# Patient Record
Sex: Female | Born: 1960 | Race: Black or African American | Hispanic: No | Marital: Married | Smoking: Former smoker
Health system: Southern US, Community
[De-identification: ages and names within clinical notes are randomized; demographics above are authoritative.]

## PROBLEM LIST (undated history)

## (undated) DIAGNOSIS — F419 Anxiety disorder, unspecified: Secondary | ICD-10-CM

## (undated) DIAGNOSIS — K589 Irritable bowel syndrome without diarrhea: Secondary | ICD-10-CM

## (undated) DIAGNOSIS — K2970 Gastritis, unspecified, without bleeding: Secondary | ICD-10-CM

## (undated) DIAGNOSIS — F329 Major depressive disorder, single episode, unspecified: Secondary | ICD-10-CM

## (undated) DIAGNOSIS — D3001 Benign neoplasm of right kidney: Secondary | ICD-10-CM

## (undated) DIAGNOSIS — K838 Other specified diseases of biliary tract: Secondary | ICD-10-CM

## (undated) DIAGNOSIS — B9681 Helicobacter pylori [H. pylori] as the cause of diseases classified elsewhere: Secondary | ICD-10-CM

## (undated) DIAGNOSIS — Z87442 Personal history of urinary calculi: Secondary | ICD-10-CM

## (undated) DIAGNOSIS — K219 Gastro-esophageal reflux disease without esophagitis: Secondary | ICD-10-CM

## (undated) DIAGNOSIS — K8020 Calculus of gallbladder without cholecystitis without obstruction: Secondary | ICD-10-CM

## (undated) DIAGNOSIS — F32A Depression, unspecified: Secondary | ICD-10-CM

## (undated) HISTORY — DX: Benign neoplasm of right kidney: D30.01

## (undated) HISTORY — PX: APPENDECTOMY: SHX54

## (undated) HISTORY — DX: Gastritis, unspecified, without bleeding: K29.70

## (undated) HISTORY — DX: Helicobacter pylori (H. pylori) as the cause of diseases classified elsewhere: B96.81

## (undated) HISTORY — DX: Irritable bowel syndrome, unspecified: K58.9

## (undated) HISTORY — PX: COLONOSCOPY: SHX174

## (undated) HISTORY — PX: CHOLECYSTECTOMY: SHX55

## (undated) HISTORY — DX: Depression, unspecified: F32.A

## (undated) HISTORY — DX: Major depressive disorder, single episode, unspecified: F32.9

## (undated) HISTORY — DX: Calculus of gallbladder without cholecystitis without obstruction: K80.20

## (undated) HISTORY — PX: UMBILICAL HERNIA REPAIR: SHX196

## (undated) HISTORY — PX: ABDOMINAL HYSTERECTOMY: SHX81

## (undated) HISTORY — DX: Other specified diseases of biliary tract: K83.8

---

## 2010-11-13 ENCOUNTER — Ambulatory Visit
Admission: RE | Admit: 2010-11-13 | Discharge: 2010-11-13 | Disposition: A | Discharge status: Home / Self Care | Payer: BC Managed Care – PPO | Source: Ambulatory Visit | Attending: Family Medicine | Admitting: Family Medicine

## 2010-11-13 ENCOUNTER — Other Ambulatory Visit: Payer: Self-pay | Admitting: Family Medicine

## 2010-11-13 DIAGNOSIS — M542 Cervicalgia: Secondary | ICD-10-CM

## 2011-09-04 ENCOUNTER — Encounter (HOSPITAL_BASED_OUTPATIENT_CLINIC_OR_DEPARTMENT_OTHER): Payer: Self-pay | Admitting: *Deleted

## 2011-09-04 ENCOUNTER — Emergency Department (HOSPITAL_BASED_OUTPATIENT_CLINIC_OR_DEPARTMENT_OTHER)
Admission: EM | Admit: 2011-09-04 | Discharge: 2011-09-04 | Disposition: A | Discharge status: Home / Self Care | Payer: BC Managed Care – PPO | Attending: Emergency Medicine | Admitting: Emergency Medicine

## 2011-09-04 DIAGNOSIS — F17200 Nicotine dependence, unspecified, uncomplicated: Secondary | ICD-10-CM | POA: Insufficient documentation

## 2011-09-04 DIAGNOSIS — M545 Low back pain, unspecified: Secondary | ICD-10-CM | POA: Insufficient documentation

## 2011-09-04 DIAGNOSIS — Z886 Allergy status to analgesic agent status: Secondary | ICD-10-CM | POA: Insufficient documentation

## 2011-09-04 DIAGNOSIS — M549 Dorsalgia, unspecified: Secondary | ICD-10-CM

## 2011-09-04 MED ORDER — ONDANSETRON HCL 4 MG/2ML IJ SOLN
4.0000 mg | Freq: Once | INTRAMUSCULAR | Status: AC
  Administered 2011-09-04: 4 mg via INTRAMUSCULAR
  Filled 2011-09-04: qty 2

## 2011-09-04 MED ORDER — HYDROMORPHONE HCL PF 2 MG/ML IJ SOLN
2.0000 mg | Freq: Once | INTRAMUSCULAR | Status: AC
  Administered 2011-09-04: 2 mg via INTRAMUSCULAR
  Filled 2011-09-04: qty 1

## 2011-09-04 MED ORDER — PREDNISONE 10 MG PO TABS: ORAL_TABLET | ORAL | Status: DC

## 2011-09-04 NOTE — ED Provider Notes (Signed)
Medical screening examination/treatment/procedure(s) were performed by non-physician practitioner and as supervising physician I was immediately available for consultation/collaboration.   Na Waldrip A. Venesha Petraitis, MD 09/04/11 2313 

## 2011-09-04 NOTE — Discharge Instructions (Signed)

## 2011-09-04 NOTE — ED Provider Notes (Signed)
History     CSN: [id number]  Arrival date & time 09/04/11  1308   First MD Initiated Contact with Patient 09/04/11 2142      Chief Complaint  Patient presents with  . Back Pain    (Consider location/radiation/quality/duration/timing/severity/associated sxs/prior treatment) Patient is a 51 y.o. female presenting with back pain. The history is provided by the patient. No language interpreter was used.  Back Pain  This is a new problem. The current episode started more than 1 week ago. The problem occurs constantly. The problem has been gradually worsening. The pain is present in the lumbar spine. The quality of the pain is described as shooting and aching. The pain radiates to the left thigh and right thigh. The pain is at a severity of 7/10. The pain is moderate. The symptoms are aggravated by bending and twisting. The pain is the same all the time. Pertinent negatives include no fever, no abdominal pain, no bladder incontinence and no dysuria. She has tried nothing for the symptoms. The treatment provided no relief.   Pt complains of pain in her low back since wearing a girdle 1 year ago.   History reviewed. No pertinent past medical history.  History reviewed. No pertinent past surgical history.  No family history on file.  History  Substance Use Topics  . Smoking status: Current Everyday Smoker -- 0.5 packs/day    Types: Cigarettes  . Smokeless tobacco: Not on file  . Alcohol Use: No    OB History    Grav Para Term Preterm Abortions TAB SAB Ect Mult Living                  Review of Systems  Constitutional: Negative for fever.  Gastrointestinal: Negative for abdominal pain.  Genitourinary: Negative for bladder incontinence and dysuria.  Musculoskeletal: Positive for back pain.  All other systems reviewed and are negative.    Allergies  Aspirin  Home Medications   Current Outpatient Rx  Name Route Sig Dispense Refill  . VIVELLE-DOT TD Transdermal Place 1 patch  onto the skin. Patient uses the patch every other day.    Marland Kitchen HYOSCYAMINE SULFATE ER 0.375 MG PO CP12 Oral Take 0.375 mg by mouth 2 (two) times daily as needed.    . OXYCODONE-ACETAMINOPHEN 10-325 MG PO TABS Oral Take 1 tablet by mouth every 4 (four) hours as needed.      BP 130/82  Pulse 72  Temp(Src) 98 F (36.7 C) (Oral)  Resp 20  SpO2 100%  Physical Exam  Nursing note and vitals reviewed. Constitutional: She is oriented to person, place, and time. She appears well-developed and well-nourished.  HENT:  Head: Normocephalic and atraumatic.  Right Ear: External ear normal.  Left Ear: External ear normal.  Nose: Nose normal.  Mouth/Throat: Oropharynx is clear and moist.  Neck: Normal range of motion.  Cardiovascular: Normal rate.   Pulmonary/Chest: Effort normal.  Abdominal: Soft.  Musculoskeletal: Normal range of motion.  Neurological: She is alert and oriented to person, place, and time.  Skin: Skin is warm.  Psychiatric: She has a normal mood and affect.    ED Course  Procedures (including critical care time)  Labs Reviewed - No data to display No results found.   1. Back pain       MDM  I advised pt to see her Physicain for recheck in 2-3 days.  I will give her rx for prednisone.  Pt advised to continue percocet  Lonia Skinner [place], [place] 09/04/11 2157

## 2011-09-04 NOTE — ED Notes (Signed)
Pt presents to ED today with low back pain x1 week.  Pt was seen by PMD and told she had muscle sprain.  Pt states pain has increased and is radiating down to knees.

## 2012-11-09 ENCOUNTER — Encounter: Payer: Self-pay | Admitting: Internal Medicine

## 2012-11-27 ENCOUNTER — Encounter: Payer: Self-pay | Admitting: Internal Medicine

## 2012-12-06 ENCOUNTER — Encounter: Payer: Self-pay | Admitting: Internal Medicine

## 2012-12-06 ENCOUNTER — Ambulatory Visit (INDEPENDENT_AMBULATORY_CARE_PROVIDER_SITE_OTHER): Payer: BC Managed Care – PPO | Admitting: Internal Medicine

## 2012-12-06 VITALS — BP 128/62 | HR 60 | Ht 66.0 in | Wt 162.0 lb

## 2012-12-06 DIAGNOSIS — K589 Irritable bowel syndrome without diarrhea: Secondary | ICD-10-CM | POA: Insufficient documentation

## 2012-12-06 DIAGNOSIS — R109 Unspecified abdominal pain: Secondary | ICD-10-CM

## 2012-12-06 DIAGNOSIS — R32 Unspecified urinary incontinence: Secondary | ICD-10-CM

## 2012-12-06 DIAGNOSIS — K529 Noninfective gastroenteritis and colitis, unspecified: Secondary | ICD-10-CM

## 2012-12-06 DIAGNOSIS — R197 Diarrhea, unspecified: Secondary | ICD-10-CM

## 2012-12-06 DIAGNOSIS — Z8619 Personal history of other infectious and parasitic diseases: Secondary | ICD-10-CM

## 2012-12-06 DIAGNOSIS — Z1211 Encounter for screening for malignant neoplasm of colon: Secondary | ICD-10-CM

## 2012-12-06 MED ORDER — PEG-KCL-NACL-NASULF-NA ASC-C 100 G PO SOLR: 1.0000 | Freq: Once | ORAL | Status: DC

## 2012-12-06 NOTE — Patient Instructions (Addendum)
You have been scheduled for a colonoscopy with propofol. Please follow written instructions given to you at your visit today.  Please pick up your prep kit at the pharmacy within the next 1-3 days. If you use inhalers (even only as needed), please bring them with you on the day of your procedure. Your physician has requested that you go to www.startemmi.com and enter the access code given to you at your visit today. This web site gives a general overview about your procedure. However, you should still follow specific instructions given to you by our office regarding your preparation for the procedure.  We have sent the following medications to your pharmacy for you to pick up at your convenience: take bentyl only with meals and continue taking prevacid. Moviprep; you were given instructions today at your office visit  You have been scheduled for an abdominal ultrasound at [facility] (1st floor of hospital) on 12/07/2012 at 7:30. Please arrive 15 minutes prior to your appointment for registration. Make certain not to have anything to eat or drink 6 hours prior to your appointment. Should you need to reschedule your appointment, please contact radiology at [phone]. This test typically takes about 30 minutes to perform.                                               We are excited to introduce MyChart, a new best-in-class service that provides you online access to important information in your electronic medical record. We want to make it easier for you to view your health information - all in one secure location - when and where you need it. We expect MyChart will enhance the quality of care and service we provide.  When you register for MyChart, you can:    View your test results.    Request appointments and receive appointment reminders via email.    Request medication renewals.    View your medical history, allergies, medications and immunizations.    Communicate with your  physician's office through a password-protected site.    Conveniently print information such as your medication lists.  To find out if MyChart is right for you, please talk to a member of our clinical staff today. We will gladly answer your questions about this free health and wellness tool.  If you are age 9 or older and want a member of your family to have access to your record, you must provide written consent by completing a proxy form available at our office. Please speak to our clinical staff about guidelines regarding accounts for patients younger than age 38.  As you activate your MyChart account and need any technical assistance, please call the MyChart technical support line at [phone]) or email your question to [email or web address]. If you email your question(s), please include your name, a return phone number and the best time to reach you.  If you have non-urgent health-related questions, you can send a message to our office through MyChart at [email or web address]. If you have a medical emergency, call 911.  Thank you for using MyChart as your new health and wellness resource!   MyChart licensed from [employer],  [id number]. Patents Pending.

## 2012-12-06 NOTE — Progress Notes (Signed)
Patient ID: Traci Johnston, female   DOB: April 20, 1961, 52 y.o.   MRN: [id number] HPI: Traci Johnston is a 52 year old female with past medical history of H. pylori status post treatment, IBS, gallstones and kidney stones who is seen to evaluate abdominal pain and postprandial diarrhea. The patient is alone today. The patient reports that her biggest complaint is fecal urgency and diarrhea after eating. She reports this is been occurring for years but feels worse over the past several months. Her diarrhea is driven by eating and thus she only eats 1 meal daily. She reports she usually eats in the late afternoon and then following her meal she'll have 15-30 minutes of loose watery bowel movements associated with lower abdominal cramping. She denies rectal bleeding or melena. She usually does not eat a full breakfast or lunch and is able to tolerate liquids such as coffee and other foods such as yogurt without significant bowel movement. Occasionally she does have epigastric abdominal pain after eating. She reports occasional nausea and vomiting but this is not her predominant complaint. She has a history of heartburn and takes lansoprazole with good relief. No dysphagia or odynophagia. She was diagnosed with H. pylori by antibody and treated with Prevpac x2. No fevers or chills. No significant weight loss. She does recall colonoscopy performed approximately 10 years ago for abdominal pain and loose stools. She does not recall being diagnosed with anything other than IBS. She recalls history of gallstones and feels that she had a cholecystectomy 20-25 years ago. She is also status post total hysterectomy around 1990. She does endorse frequent urinary incontinence worse with Valsalva. No dysuria or hematuria.  She has used Bentyl to help with abdominal cramping with some success  Past Medical History  Diagnosis Date  . IBS (irritable bowel syndrome)   . Kidney stones   . Gallstones     Past Surgical History   Procedure Laterality Date  . Abdominal hysterectomy    . Appendectomy      Current Outpatient Prescriptions  Medication Sig Dispense Refill  . dicyclomine (BENTYL) 20 MG tablet As directed      . Estradiol (VIVELLE-DOT TD) Place 1 patch onto the skin. Patient uses the patch every other day.      . hyoscyamine (LEVSINEX) 0.375 MG 12 hr capsule Take 0.375 mg by mouth 2 (two) times daily as needed.      . lansoprazole (PREVACID) 30 MG capsule Take 30 mg by mouth daily.      Marland Kitchen MINIVELLE 0.1 MG/24HR As directed      . oxyCODONE-acetaminophen (PERCOCET) 10-325 MG per tablet Take 1 tablet by mouth every 4 (four) hours as needed.      . predniSONE (DELTASONE) 10 MG tablet 6,5,4,3,2,1 taper  21 tablet  0  . peg 3350 powder (MOVIPREP) 100 G SOLR Take 1 kit (100 g total) by mouth once.  1 kit  0   No current facility-administered medications for this visit.    Allergies  Allergen Reactions  . Aspirin     hyperventilate    Family History  Problem Relation Age of Onset  . Colon cancer Neg Hx     History  Substance Use Topics  . Smoking status: Former Smoker -- 0.50 packs/day    Types: Cigarettes  . Smokeless tobacco: Never Used     Comment: 05-2012///Will use electronic cigarette   . Alcohol Use: No    ROS: As per history of present illness, otherwise negative  BP 128/62  Pulse 60  Ht 5\' 6"  (1.676 m)  Wt 162 lb (73.483 kg)  BMI 26.16 kg/m2 Constitutional: Well-developed and well-nourished. No distress. HEENT: Normocephalic and atraumatic. Oropharynx is clear and moist. No oropharyngeal exudate. Conjunctivae are normal.  No scleral icterus. Neck: Neck supple. Trachea midline. Cardiovascular: Normal rate, regular rhythm and intact distal pulses. No M/R/G Pulmonary/chest: Effort normal and breath sounds normal. No wheezing, rales or rhonchi. Abdominal: Soft, nontender, nondistended. Bowel sounds active throughout. There are no masses palpable. No  hepatosplenomegaly. Extremities: no clubbing, cyanosis, or edema Lymphadenopathy: No cervical adenopathy noted. Neurological: Alert and oriented to person place and time. Skin: Skin is warm and dry. No rashes noted. Psychiatric: Normal mood and affect. Behavior is normal.  RELEVANT LABS AND IMAGING: Labs dated 05/26/2012 - H. pylori antibody 5.97 (positive) Comprehensive metabolic panel within normal limits WBC 6.8, hemoglobin 14.5, platelet count 216, MCV 85.4  ASSESSMENT/PLAN:  52 year old female with past medical history of H. pylori status post treatment, IBS, gallstones and kidney stones who is seen to evaluate abdominal pain and postprandial diarrhea.  1.  Post-prandial diarrhea/abd pain -- the patient's symptoms seem most consistent with irritable bowel syndrome. However she is having some epigastric abdominal pain which seems to be different than her postprandial loose stools and lower abdominal cramping. She has been maintained on lansoprazole 30 mg daily, and has had upper abdominal pain despite PPI. I recommended a complete abdominal ultrasound and upper endoscopy. We discussed the test today including the risks and benefits and she is agreeable to proceed. I've also recommended colonoscopy for screening and to evaluate her chronic diarrhea. If this is negative, she may be an excellent candidate for a trial of Lotronex.  She will continue Bentyl on an as-needed basis for now (she was using this on a scheduled basis, but given that her symptoms seem only related to eating, I have recommended using it as needed around the time of her meals)  2. CRC screening, chronic diarrhea -- colonoscopy as discussed above.  3.  Hx of H. Pylori -- Will plan gastric biopsies to confirm eradication of the time for upper endoscopy  4.  Urinary incontinence -- urology referral

## 2012-12-07 ENCOUNTER — Telehealth: Payer: Self-pay | Admitting: *Deleted

## 2012-12-07 ENCOUNTER — Ambulatory Visit (HOSPITAL_COMMUNITY)
Admission: RE | Admit: 2012-12-07 | Discharge: 2012-12-07 | Disposition: A | Discharge status: Home / Self Care | Payer: BC Managed Care – PPO | Source: Ambulatory Visit | Attending: Internal Medicine | Admitting: Internal Medicine

## 2012-12-07 DIAGNOSIS — K838 Other specified diseases of biliary tract: Secondary | ICD-10-CM

## 2012-12-07 DIAGNOSIS — R109 Unspecified abdominal pain: Secondary | ICD-10-CM | POA: Insufficient documentation

## 2012-12-07 DIAGNOSIS — R110 Nausea: Secondary | ICD-10-CM | POA: Insufficient documentation

## 2012-12-07 DIAGNOSIS — Z8619 Personal history of other infectious and parasitic diseases: Secondary | ICD-10-CM

## 2012-12-07 DIAGNOSIS — N289 Disorder of kidney and ureter, unspecified: Secondary | ICD-10-CM

## 2012-12-07 DIAGNOSIS — Z1211 Encounter for screening for malignant neoplasm of colon: Secondary | ICD-10-CM

## 2012-12-07 NOTE — Telephone Encounter (Signed)
Message copied by Florene Glen on Thu Dec 07, 2012 10:17 AM ------      Message from: Beverley Fiedler      Created: Thu Dec 07, 2012  9:24 AM       Gallbladder absent (she recalls remote cholecystectomy); CBD is dilated, which can be present after gallbladder removed, but would proceed to MRI abd with contrast + MRCP for further characterization.      Possible kidney lesion, which MRI will also further characterize.      Please have her come for BMET, hepatic function panel, and CBC      Thanks       ------

## 2012-12-07 NOTE — Telephone Encounter (Signed)
Informed pt of the need for labs and MRI/MRCP. She is scheduled for 12/11/12 at 7pm, but should arrive at 6:45PM; NPO 4 hours prior. She also needs to come here for labs and Dr Rhea Belton states she may r/s her ECL scheduled for tomorrow until her results are in. Pt reports she will go ahead with the ECL since she has begun the prep. She will come for labs in the am prior to her procedures. Will leave her instructions on MRI/MRCP with [facility]. Pt stated understanding.

## 2012-12-08 ENCOUNTER — Telehealth: Payer: Self-pay | Admitting: Internal Medicine

## 2012-12-08 ENCOUNTER — Other Ambulatory Visit (INDEPENDENT_AMBULATORY_CARE_PROVIDER_SITE_OTHER): Payer: BC Managed Care – PPO

## 2012-12-08 ENCOUNTER — Encounter: Payer: Self-pay | Admitting: Internal Medicine

## 2012-12-08 ENCOUNTER — Ambulatory Visit (AMBULATORY_SURGERY_CENTER): Payer: BC Managed Care – PPO | Admitting: Internal Medicine

## 2012-12-08 VITALS — BP 122/71 | HR 50 | Temp 97.2°F | Resp 32 | Ht 66.0 in | Wt 162.0 lb

## 2012-12-08 DIAGNOSIS — K589 Irritable bowel syndrome without diarrhea: Secondary | ICD-10-CM

## 2012-12-08 DIAGNOSIS — K838 Other specified diseases of biliary tract: Secondary | ICD-10-CM

## 2012-12-08 DIAGNOSIS — K529 Noninfective gastroenteritis and colitis, unspecified: Secondary | ICD-10-CM

## 2012-12-08 DIAGNOSIS — R197 Diarrhea, unspecified: Secondary | ICD-10-CM

## 2012-12-08 DIAGNOSIS — D126 Benign neoplasm of colon, unspecified: Secondary | ICD-10-CM

## 2012-12-08 DIAGNOSIS — N289 Disorder of kidney and ureter, unspecified: Secondary | ICD-10-CM

## 2012-12-08 DIAGNOSIS — A048 Other specified bacterial intestinal infections: Secondary | ICD-10-CM

## 2012-12-08 DIAGNOSIS — R109 Unspecified abdominal pain: Secondary | ICD-10-CM

## 2012-12-08 DIAGNOSIS — R1013 Epigastric pain: Secondary | ICD-10-CM

## 2012-12-08 DIAGNOSIS — Z1211 Encounter for screening for malignant neoplasm of colon: Secondary | ICD-10-CM

## 2012-12-08 DIAGNOSIS — D1330 Benign neoplasm of unspecified part of small intestine: Secondary | ICD-10-CM

## 2012-12-08 DIAGNOSIS — Z8619 Personal history of other infectious and parasitic diseases: Secondary | ICD-10-CM

## 2012-12-08 LAB — BASIC METABOLIC PANEL
CO2: 31 mEq/L (ref 19–32)
Calcium: 9.6 mg/dL (ref 8.4–10.5)
GFR: 88.41 mL/min (ref >60.00)
Potassium: 5.1 mEq/L (ref 3.5–5.1)
Sodium: 142 mEq/L (ref 135–145)

## 2012-12-08 LAB — HEPATIC FUNCTION PANEL
AST: 25 U/L (ref 0–37)
Alkaline Phosphatase: 71 U/L (ref 39–117)
Bilirubin, Direct: 0 mg/dL (ref 0.0–0.3)

## 2012-12-08 LAB — CBC WITH DIFFERENTIAL/PLATELET
Basophils Absolute: 0 10*3/uL (ref 0.0–0.1)
HCT: 38.5 % (ref 36.0–46.0)
Lymphs Abs: 2.2 10*3/uL (ref 0.7–4.0)
Monocytes Absolute: 0.3 10*3/uL (ref 0.1–1.0)
Monocytes Relative: 5.5 % (ref 3.0–12.0)
Neutrophils Relative %: 48.6 % (ref 43.0–77.0)
Platelets: 263 10*3/uL (ref 150.0–400.0)
RDW: 13 % (ref 11.5–14.6)

## 2012-12-08 MED ORDER — SODIUM CHLORIDE 0.9 % IV SOLN: 500.0000 mL | INTRAVENOUS | Status: DC

## 2012-12-08 NOTE — Progress Notes (Signed)
Report to RN. VSS. tol procedures well. No complaints voiced.

## 2012-12-08 NOTE — Progress Notes (Signed)
Patient did not experience any of the following events: a burn prior to discharge; a fall within the facility; wrong site/side/patient/procedure/implant event; or a hospital transfer or hospital admission upon discharge from the facility. (G8907) Patient did not have preoperative order for IV antibiotic SSI prophylaxis. (G8918)  

## 2012-12-08 NOTE — Op Note (Signed)
[facility] [address] [place], [address]   ENDOSCOPY PROCEDURE REPORT  PATIENT: Traci Johnston, Traci Johnston  MR#: [id number] BIRTHDATE: Jun 04, 1961 , 51  yrs. old GENDER: Female ENDOSCOPIST: Beverley Fiedler, MD PROCEDURE DATE:  12/08/2012 PROCEDURE:  EGD w/ biopsy ASA CLASS:     Class II INDICATIONS:  Epigastric pain.   Unexplained diarrhea.   history of treated H.  pylori (dx by serum Ab). MEDICATIONS: MAC sedation, administered by CRNA and propofol (Diprivan) 200mg  IV TOPICAL ANESTHETIC: none  DESCRIPTION OF PROCEDURE: After the risks benefits and alternatives of the procedure were thoroughly explained, informed consent was obtained.  The LB [id number] endoscope was introduced through the mouth and advanced to the second portion of the duodenum. Without limitations.  The instrument was slowly withdrawn as the mucosa was fully examined.     ESOPHAGUS: The mucosa of the esophagus appeared normal.  STOMACH: Mild and congested gastritis with erythema (inflammation) was found on the greater curvature of the gastric body.  Multiple biopsies were performed using cold forceps.   A small hiatal hernia was noted.  DUODENUM: The duodenal mucosa showed no abnormalities in the bulb and second portion of the duodenum.  Cold forceps biopsies were taken in the bulb and second portion.  Retroflexed views revealed a hiatal hernia.     The scope was then withdrawn from the patient and the procedure completed. COMPLICATIONS: There were no complications.  ENDOSCOPIC IMPRESSION: 1.   The mucosa of the esophagus appeared normal 2.   Gastritis (inflammation) was found on the greater curvature of the gastric body; multiple biopsies 3.   Small hiatal hernia 4.   The duodenal mucosa showed no abnormalities in the bulb and second portion of the duodenum  RECOMMENDATIONS: 1.  Await pathology results 2.  Follow-up of helicobacter pylori status, treat if indicated 3.  Continue  taking your PPI (lansoprazole) once daily.  It is best to be taken 20-30 minutes prior to breakfast meal.  eSigned:  Beverley Fiedler, MD 12/08/2012 11:59 AM CC:The Patient

## 2012-12-08 NOTE — Patient Instructions (Addendum)

## 2012-12-08 NOTE — Progress Notes (Signed)
Called to room to assist during endoscopic procedure.  Patient ID and intended procedure confirmed with present staff. Received instructions for my participation in the procedure from the performing physician. ewm 

## 2012-12-08 NOTE — Op Note (Signed)
[facility] [address] [place], [address]   COLONOSCOPY PROCEDURE REPORT  PATIENT: Traci Johnston, Traci Johnston  MR#: [id number] BIRTHDATE: Nov 04, 1960 , 51  yrs. old GENDER: Female ENDOSCOPIST: Beverley Fiedler, MD PROCEDURE DATE:  12/08/2012 PROCEDURE:   Colonoscopy with biopsy and Colonoscopy with cold biopsy polypectomy ASA CLASS:   Class II INDICATIONS:chronic diarrhea, average risk screening, Last colonoscopy performed 10 years ago, and Abdominal pain. MEDICATIONS: MAC sedation, administered by CRNA and propofol (Diprivan) 200mg  IV  DESCRIPTION OF PROCEDURE:   After the risks benefits and alternatives of the procedure were thoroughly explained, informed consent was obtained.  A digital rectal exam revealed no rectal mass and decreased rectal tone.   The LB [id number] endoscope was introduced through the anus and advanced to the terminal ileum which was intubated for a short distance. No adverse events experienced.   The quality of the prep was good, using MoviPrep  The instrument was then slowly withdrawn as the colon was fully examined.   COLON FINDINGS: The mucosa appeared normal in the terminal ileum. The colonic mucosa appeared normal throughout the entire examined colon.  Multiple random biopsies of the area were performed.   A diminutive sessile polyp was found in the rectum.  A polypectomy was performed with cold forceps.  The resection was complete and the polyp tissue was completely retrieved.  Retroflexed views revealed no abnormalities. The time to cecum=2 minutes 46 seconds. Withdrawal time=14 minutes 55 seconds.  The scope was withdrawn and the procedure completed. COMPLICATIONS: There were no complications.  ENDOSCOPIC IMPRESSION: 1.   Normal mucosa in the terminal ileum 2.   The colonic mucosa appeared normal throughout the entire examined colon; multiple random biopsies of the area were performed  3.   Diminutive sessile polyp was found in the  rectum; polypectomy was performed with cold forceps  RECOMMENDATIONS: 1.  Await pathology results 2.  Can use over-the-counter loperamide per box instructions to help control diarrhea while awaiting pathology results 3.  If the polyp removed today is proven to be an adenomatous (pre-cancerous) polyp, you will need a repeat colonoscopy in 5 years.  Otherwise you should continue to follow colorectal cancer screening guidelines for "routine risk" patients with colonoscopy in 10 years.  You will receive a letter within 1-2 weeks with the results of your biopsy as well as final recommendations.  Please call my office if you have not received a letter after 3 weeks.   eSigned:  Beverley Fiedler, MD 12/08/2012 12:06 PM cc: The Patient and Reese,Betti MD

## 2012-12-11 ENCOUNTER — Telehealth: Payer: Self-pay | Admitting: *Deleted

## 2012-12-11 ENCOUNTER — Ambulatory Visit (HOSPITAL_COMMUNITY)
Admission: RE | Admit: 2012-12-11 | Discharge: 2012-12-11 | Disposition: A | Discharge status: Home / Self Care | Payer: BC Managed Care – PPO | Source: Ambulatory Visit | Attending: Internal Medicine | Admitting: Internal Medicine

## 2012-12-11 DIAGNOSIS — N289 Disorder of kidney and ureter, unspecified: Secondary | ICD-10-CM

## 2012-12-11 DIAGNOSIS — K838 Other specified diseases of biliary tract: Secondary | ICD-10-CM | POA: Insufficient documentation

## 2012-12-11 DIAGNOSIS — R1013 Epigastric pain: Secondary | ICD-10-CM | POA: Insufficient documentation

## 2012-12-11 DIAGNOSIS — K7689 Other specified diseases of liver: Secondary | ICD-10-CM | POA: Insufficient documentation

## 2012-12-11 DIAGNOSIS — Z9089 Acquired absence of other organs: Secondary | ICD-10-CM | POA: Insufficient documentation

## 2012-12-11 MED ORDER — GADOBENATE DIMEGLUMINE 529 MG/ML IV SOLN
15.0000 mL | Freq: Once | INTRAVENOUS | Status: AC | PRN
  Administered 2012-12-11: 15 mL via INTRAVENOUS

## 2012-12-11 MED ORDER — DIAZEPAM 5 MG PO TABS: ORAL_TABLET | ORAL | Status: DC

## 2012-12-11 NOTE — Telephone Encounter (Signed)
Yes, 5-10 mg once 1 hour before MRI.  If she uses this med, she should NOT drive herself to the test.

## 2012-12-11 NOTE — Telephone Encounter (Signed)
Can we give pt Valium for MRI tonight? Thanks.

## 2012-12-11 NOTE — Telephone Encounter (Signed)
Left message on number given in admitting by pt to return call if problems questions or concerns. ewm

## 2012-12-11 NOTE — Telephone Encounter (Signed)
Informed pt I ordered Valium and gave her instruction; she had already told me her husband was driving her tonight. We went over her prep and discussed directions and pt stated understanding.

## 2012-12-12 ENCOUNTER — Telehealth: Payer: Self-pay | Admitting: Internal Medicine

## 2012-12-12 ENCOUNTER — Encounter: Payer: Self-pay | Admitting: Internal Medicine

## 2012-12-12 MED ORDER — LANSOPRAZOLE 30 MG PO CPDR: DELAYED_RELEASE_CAPSULE | ORAL | Status: DC

## 2012-12-12 MED ORDER — BIS SUBCIT-METRONID-TETRACYC 140-125-125 MG PO CAPS: 3.0000 | ORAL_CAPSULE | Freq: Three times a day (TID) | ORAL | Status: DC

## 2012-12-12 NOTE — Telephone Encounter (Signed)
lmom for pt to call back. Pt originally scheduled to see Dr Berneice Heinrich on 12/26/12 at 4pm; faxed info to [phone]. Dr Rhea Belton wanted me to schedule pt for an earlier appt and pt was given an appt for 12/14/12 at 3pm with Dr Mena Goes. Pt is aware.

## 2012-12-13 ENCOUNTER — Telehealth: Payer: Self-pay | Admitting: Internal Medicine

## 2012-12-13 MED ORDER — OXYCODONE-ACETAMINOPHEN 10-325 MG PO TABS: ORAL_TABLET | ORAL | Status: DC

## 2012-12-13 NOTE — Telephone Encounter (Signed)
Spoke with pt's husband and gave him copies of the u/s anf MRI. They will call for further problems.

## 2012-12-13 NOTE — Telephone Encounter (Signed)
lmom for pt that we are waiting on forms for prior auth or override for Pylera.

## 2012-12-13 NOTE — Telephone Encounter (Signed)
Pt had left me a message and I called her to inform her I faxed the request to change drugs to [facility]. Pt c/o pain and asked how long it will take because she is having sharp pains. Asked if she'd taken Bentyl and she stated yes. She states she had Percocet and she is out. Amy Esterwood, PA agreed to give her 20 tabs for pain.

## 2012-12-13 NOTE — Telephone Encounter (Signed)
Faxed tier exception application to [phone]

## 2012-12-14 NOTE — Telephone Encounter (Signed)
Pt states he son bought the medicine and hopefully insurance will reimburse them. She states her pain is bearable with the narcotic. Informed her Dr Rhea Belton spoke with Dr Mena Goes who was very encouraged about the tumor and dx. I will call her later about [employer]; pt stated understanding.

## 2012-12-15 ENCOUNTER — Other Ambulatory Visit (HOSPITAL_COMMUNITY): Payer: Self-pay | Admitting: Urology

## 2012-12-15 ENCOUNTER — Ambulatory Visit (HOSPITAL_COMMUNITY)
Admission: RE | Admit: 2012-12-15 | Discharge: 2012-12-15 | Disposition: A | Discharge status: Home / Self Care | Payer: BC Managed Care – PPO | Source: Ambulatory Visit | Attending: Urology | Admitting: Urology

## 2012-12-15 ENCOUNTER — Telehealth: Payer: Self-pay | Admitting: Internal Medicine

## 2012-12-15 DIAGNOSIS — D4959 Neoplasm of unspecified behavior of other genitourinary organ: Secondary | ICD-10-CM | POA: Insufficient documentation

## 2012-12-15 DIAGNOSIS — M4120 Other idiopathic scoliosis, site unspecified: Secondary | ICD-10-CM | POA: Insufficient documentation

## 2012-12-15 MED ORDER — CLONAZEPAM 0.5 MG PO TABS: ORAL_TABLET | ORAL | Status: DC

## 2012-12-15 NOTE — Telephone Encounter (Signed)
Pt called to discuss her appt with urology yesterday and is concerned she needs a chest xray. 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 Explained a chest xray used to be routine for pts >50years prior to surgery; pt admitted she quit smoking last year. She also asked if she should keep the mammogram appt for 12/21/12; she had called another office and they stated she should proceed because she could have cancer there also. Of course, this increased her anxiety even more. We discussed the fact she has never had a mammo before and if it will increase her anxiety even more, should she wait until after she deals with her renal cancer? Pt agreed to go ahead and have it done. I encouraged pt to call her PCP to ewquest something for anxiety and she asked if I would call Dr Pecola Leisure. Called Dr Haskel Schroeder ofc and she is not in today. Pt will need an appt to request any anti anxiety meds. Dr Rhea Belton agreed to order med for one time; informed pt and asked her not to drive or take alcohol while taking clonazepam and pt stated understanding.

## 2012-12-21 ENCOUNTER — Other Ambulatory Visit: Payer: Self-pay | Admitting: Urology

## 2012-12-21 NOTE — Telephone Encounter (Signed)
Pt called back and I explained everything to her about her policy. Pt stated understanding.

## 2012-12-21 NOTE — Telephone Encounter (Signed)
Late entry 12/14/12 received a reply from [employer] statin "pylera did not require prior auth". Called today and spoke with Lawanna Kobus B. At [phone]. I was then referred to [phone]. Found out pt's lifetime maximum benefit for meds is $1200; after this is met, it will pay 50% co insurance on the drugs. lmom for pt to call back.

## 2013-01-01 ENCOUNTER — Encounter (HOSPITAL_COMMUNITY): Payer: Self-pay | Admitting: Pharmacy Technician

## 2013-01-02 ENCOUNTER — Telehealth: Payer: Self-pay | Admitting: *Deleted

## 2013-01-02 NOTE — Telephone Encounter (Signed)
I spoke with pt last week and she stated she had days where she had abdominal cramping and IBS symptoms such as diarrhea alternating with constipation. I asked her to try Dicyclomine for effectiveness and to call with report. Pt reports she did start taking Dicyclomine and it has helped with cramping. However, as long as she doesn't eat; yesterday she ate a salad and then had yogurt last night. She states if she eats a larger meal she either gets diarrhea or constipation that leads to diarrhea. She states she seems to have problems with whole grains such as corn and rice and she can't tolerate fried foods at all. Encouraged pt to increase her protein to aid in healing after her surgery; surgery is 01/08/13 for the renal lesion. Please advise; OV, Gluten diet even though path shows her negative? Thanks.

## 2013-01-03 ENCOUNTER — Encounter (HOSPITAL_COMMUNITY): Payer: Self-pay

## 2013-01-03 ENCOUNTER — Encounter (HOSPITAL_COMMUNITY)
Admission: RE | Admit: 2013-01-03 | Discharge: 2013-01-03 | Disposition: A | Discharge status: Home / Self Care | Payer: BC Managed Care – PPO | Source: Ambulatory Visit | Attending: Urology | Admitting: Urology

## 2013-01-03 DIAGNOSIS — I498 Other specified cardiac arrhythmias: Secondary | ICD-10-CM | POA: Insufficient documentation

## 2013-01-03 DIAGNOSIS — Z01812 Encounter for preprocedural laboratory examination: Secondary | ICD-10-CM | POA: Insufficient documentation

## 2013-01-03 DIAGNOSIS — D4959 Neoplasm of unspecified behavior of other genitourinary organ: Secondary | ICD-10-CM | POA: Insufficient documentation

## 2013-01-03 HISTORY — DX: Personal history of urinary calculi: Z87.442

## 2013-01-03 HISTORY — DX: Gastro-esophageal reflux disease without esophagitis: K21.9

## 2013-01-03 HISTORY — DX: Anxiety disorder, unspecified: F41.9

## 2013-01-03 LAB — TYPE AND SCREEN: Antibody Screen: NEGATIVE

## 2013-01-03 LAB — CBC
Platelets: 206 10*3/uL (ref 150–400)
RDW: 13.6 % (ref 11.5–15.5)
WBC: 5.6 10*3/uL (ref 4.0–10.5)

## 2013-01-03 LAB — BASIC METABOLIC PANEL
Calcium: 9.6 mg/dL (ref 8.4–10.5)
Creatinine, Ser: 0.85 mg/dL (ref 0.50–1.10)
GFR calc Af Amer: >90 mL/min (ref >90)
GFR calc non Af Amer: 79 mL/min — ABNORMAL LOW (ref >90)

## 2013-01-03 NOTE — Pre-Procedure Instructions (Signed)
01-03-13 EKG done today/ CXR 6'14 -Epic.

## 2013-01-03 NOTE — Patient Instructions (Addendum)
20 Tysheena Ginzburg  01/03/2013   Your procedure is scheduled on:  7-21 -2014  Report to [facility] at     0515   AM .  Call this number if you have problems the morning of surgery: [phone]  Or Presurgical Testing [phone](Solace Wendorff)   Remember: Follow any bowel prep instructions per MD office.    Do not eat food:After Midnight.    Take these medicines the morning of surgery with A SIP OF WATER: Clonazepam. Prevacid. Oxycodone. No Aspirin. aleve , ibuprofen, herbal or over the counter vitamins supplements.   Do not wear jewelry, make-up or nail polish.  Do not wear lotions, powders, or perfumes. You may wear deodorant.  Do not shave 12 hours prior to first CHG shower(legs and under arms).(face and neck okay.)  Do not bring valuables to the hospital.  Contacts, dentures or bridgework,body piercing,  may not be worn into surgery.  Leave suitcase in the car. After surgery it may be brought to your room.  For patients admitted to the hospital, checkout time is 11:00 AM the day of discharge.   Patients discharged the day of surgery will not be allowed to drive home. Must have responsible person with you x 24 hours once discharged.  Name and phone number of your driver: daughter - Mersedes Alber- [phone] cell  Special Instructions: CHG(Chlorhedine 4%-"Hibiclens","Betasept","Aplicare") Shower Use Special Wash: see special instructions.(avoid face and genitals)   Please read over the following fact sheets that you were given:  Blood Transfusion fact sheet, Incentive Spirometry Instruction.    Failure to follow these instructions may result in Cancellation of your surgery.   Patient signature_______________________________________________________

## 2013-01-05 NOTE — H&P (Signed)
Chief Complaint  Right renal neoplasm   Reason For Visit  Reason for consult: To discuss minimally invasive nephron sparing surgery for her right renal neoplasm Physician requesting consult: Dr. Jerilee Field PCP: Dr. Pecola Leisure   History of Present Illness  [name] is a 52 year old female who developed postprandial pain and mild nausea prompting an abdominal ultrasound in June 2014 by Dr. Rhea Belton.  This incidentally demonstrated a 1.8 cm solid lower pole right renal mass.  Further evaluation with an MRI of the abdomen confirmed a 1.9 x 1.8 cm enhancing mass of the posterior aspect of the lower pole of the right kidney concerning for renal cell carcinoma. The mass is 50% exophytic.  There is no regional lymphadenopathy, adrenal masses, or concerning contralteral renal masses. There is no renal vein/IVC involvement and no other evidence of metastatic disease in the abdomen.  She did have a few hepatic cysts which appear benign. Her chest x-ray was negative for metastatic disease. Her presenting symptoms have not had a clear etiology although appear to be from a GI source.  She continues to have intermittent generalized abdominal pain and she will infrequently take hydrocodone for pain relief.  Although she did have some dilation of her CBD, this did not appear concerning on an MRCP. She has prevously undergone a cholecystectomy, hysterectomy, and appendectomy. She has denied hematuria or flank pain.  There is no family history of ESRD or kidney cancer.  Her prior surgical history includes a history of an open appendectomy, open cholecystectomy with an upper midline incision, and Pfannenstiel incision for a hysterectomy for benign reasons.  Her baseline renal function is normal with a creatinine of 0.9 (eGFR 88 ml/min) on 12/08/12.  Her LFTs were also normal.  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  She is treated with hyoscyamine.   Past Medical History Problems  1. History of   Esophageal Reflux 530.81 2. History of  Gastric Ulcer 531.90  Surgical History Problems  1. History of  Appendectomy 2. History of  Cholecystectomy 3. History of  Hysterectomy V45.77  Current Meds 1. Lansoprazole 15 MG Oral Capsule Delayed Release; Therapy: (Recorded:26Jun2014) to 2. Minivelle 0.1 MG/24HR Transdermal Patch Biweekly; Therapy: (Recorded:26Jun2014) to 3. Pylera CAPS; Therapy: (Recorded:26Jun2014) to  Allergies Medication  1. Aspirin TABS  Family History Problems  1. Sororal history of  Breast Cancer V16.3 2. Maternal history of  Dementia 3. Family history of  Family Health Status Number Of Children 2 sons 1 daughter 4. Maternal history of  Hypertension V17.49 5. Paternal history of  Liver Cancer 6. Paternal history of  Prostate Cancer V16.42  Social History Problems    Former Smoker V15.82 smoked for 20 yrs quit 57yr ago   Marital History - Currently Married   Recently Stopping Smoking V15.82 Denied    History of  Alcohol Use  Review of Systems Genitourinary, constitutional, skin, eye, otolaryngeal, hematologic/lymphatic, cardiovascular, pulmonary, endocrine, musculoskeletal, gastrointestinal, neurological and psychiatric system(s) were reviewed and pertinent findings if present are noted.  Genitourinary: no hematuria.  Gastrointestinal: nausea and abdominal pain.  Constitutional: no recent weight loss.  Cardiovascular: no leg swelling.    Vitals Vital Signs [Data Includes: Last 1 Day]  01Jul2014 01:02PM  BMI Calculated: 25.72 BSA Calculated: 1.82 Height: 5 ft 6 in Weight: 160 lb  Blood Pressure: 128 / 84 Heart Rate: 74 26Jun2014 04:27PM  BMI Calculated: 25.72 BSA Calculated: 1.82 Height: 5 ft 6 in Weight: 160 lb  Blood Pressure: 115 / 80  Temperature: 98.4 F Heart Rate: 62  Physical Exam Constitutional: Well nourished and well developed . No acute distress.  ENT:. The ears and nose are normal in appearance.  Neck: The appearance of the  neck is normal and no neck mass is present.  Pulmonary: No respiratory distress, normal respiratory rhythm and effort and clear bilateral breath sounds.  Cardiovascular: Heart rate and rhythm are normal . No peripheral edema.  Abdomen: right lower quadrant, upper midline, Pfannensteil incision site(s) well healed. The abdomen is soft and nontender. No masses are palpated. No CVA tenderness. No hernias are palpable. No hepatosplenomegaly noted.  Lymphatics: The supraclavicular, femoral and inguinal nodes are not enlarged or tender.  Skin: Normal skin turgor, no visible rash and no visible skin lesions.  Neuro/Psych:. Mood and affect are appropriate.    Results/Data  Urine [Data Includes: Last 1 Day]   01Jul2014  COLOR YELLOW 1  APPEARANCE CLEAR 1  SPECIFIC GRAVITY 1.025 1  pH 5.5 1  GLUCOSE NEG mg/dL1  BILIRUBIN NEG 1  KETONE NEG mg/dL1  BLOOD NEG 1  PROTEIN NEG mg/dL1  UROBILINOGEN 0.2 mg/dL1  NITRITE NEG 1  LEUKOCYTE ESTERASE TRACE 1  SQUAMOUS EPITHELIAL/HPF RARE 1  WBC 0-2 WBC/hpf1  RBC 0-2 RBC/hpf1  BACTERIA RARE 1  CRYSTALS NONE SEEN 1  CASTS NONE SEEN 1    1. Amended By: Heloise Purpura; 12/19/2012 1:44 PMEST Selected Results  UA With REFLEX 01Jul2014 12:55PM Heloise Purpura   Test Name Result Flag Reference  COLOR YELLOW  YELLOW  APPEARANCE CLEAR  CLEAR  SPECIFIC GRAVITY 1.025  1.005-1.030  pH 5.5  5.0-8.0  GLUCOSE NEG mg/dL  NEG  BILIRUBIN NEG  NEG  KETONE NEG mg/dL  NEG  BLOOD NEG  NEG  PROTEIN NEG mg/dL  NEG  UROBILINOGEN 0.2 mg/dL  1.6-1.0  NITRITE NEG  NEG  LEUKOCYTE ESTERASE TRACE A NEG  SQUAMOUS EPITHELIAL/HPF RARE  RARE  WBC 0-2 WBC/hpf  <3  RBC 0-2 RBC/hpf  <3  BACTERIA RARE  RARE  CRYSTALS NONE SEEN  NONE SEEN  CASTS NONE SEEN  NONE SEEN     I independently reviewed her chest x-ray, laboratory studies, and MRI. Findings are as dictated above.  Amended By: Heloise Purpura; 12/19/2012 1:44 PMEST Assessment Assessed  1. Renal Neoplasm 239.5 2. Urge  And Stress Incontinence 788.33  Plan Renal Neoplasm (239.5)  1. Follow-up Schedule Surgery Office  Follow-up  Done: 01Jul2014  Discussion/Summary  1.  Right renal neoplasm concerning for malignancy:   The patient was provided information regarding their renal mass including the relative risk of benign versus malignant pathology and the natural history of renal cell carcinoma and other possible malignancies of the kidney. The role of renal biopsy, laboratory testing, and imaging studies to further characterize renal masses and/or the presence of metastatic disease were explained. We discussed the role of active surveillance, surgical therapy with both radical nephrectomy and nephron-sparing surgery, and ablative therapy in the treatment of renal masses. In addition, we discussed our goals of providing an accurate diagnosis and oncologic control while maintaining optimal renal function as appropriate based on the size, location, and complexity of their renal mass as well as their co-morbidities.    We have discussed the risks of treatment in detail including but not limited to bleeding, infection, heart attack, stroke, death, venothromoboembolism, cancer recurrence, injury/damage to surrounding organs and structures, urine leak, the possibility of open surgical conversion for patients undergoing minimally invasive surgery, the risk of developing chronic kidney disease  and its associated implications, and the potential risk of end stage renal disease possibly necessitating dialysis.   I reviewed her MRI with her and her family today.  I recommended that she proceed with a right partial nephrectomy based on the concern for renal cell carcinoma.  Our plan is to proceed with a right robotic-assisted laparoscopic approach although she understands the potential risk for open surgical conversion considering her prior surgical history.  This will be scheduled for the near future.  2.  Mixed incontinence:  Following recovery from her surgery for her renal mass, I will have her see Dr. Mena Goes for further evaluation of her incontinence.  Cc: Dr. Jerilee Field Dr. Pecola Leisure    SignaturesElectronically signed by : Heloise Purpura, M.D.; Dec 19 2012  1:44PM

## 2013-01-08 ENCOUNTER — Ambulatory Visit (HOSPITAL_COMMUNITY): Payer: BC Managed Care – PPO | Admitting: Anesthesiology

## 2013-01-08 ENCOUNTER — Encounter (HOSPITAL_COMMUNITY): Payer: Self-pay | Admitting: Anesthesiology

## 2013-01-08 ENCOUNTER — Encounter (HOSPITAL_COMMUNITY): Payer: Self-pay | Admitting: *Deleted

## 2013-01-08 ENCOUNTER — Inpatient Hospital Stay (HOSPITAL_COMMUNITY)
Admission: RE | Admit: 2013-01-08 | Discharge: 2013-01-12 | DRG: 303 | Disposition: A | Discharge status: Home / Self Care | Payer: BC Managed Care – PPO | Source: Ambulatory Visit | Attending: Urology | Admitting: Urology

## 2013-01-08 ENCOUNTER — Encounter (HOSPITAL_COMMUNITY)
Admission: RE | Disposition: A | Discharge status: Home / Self Care | Payer: Self-pay | Source: Ambulatory Visit | Attending: Urology

## 2013-01-08 DIAGNOSIS — Z800 Family history of malignant neoplasm of digestive organs: Secondary | ICD-10-CM

## 2013-01-08 DIAGNOSIS — Z9089 Acquired absence of other organs: Secondary | ICD-10-CM

## 2013-01-08 DIAGNOSIS — K7689 Other specified diseases of liver: Secondary | ICD-10-CM | POA: Diagnosis present

## 2013-01-08 DIAGNOSIS — K219 Gastro-esophageal reflux disease without esophagitis: Secondary | ICD-10-CM | POA: Diagnosis present

## 2013-01-08 DIAGNOSIS — Z8711 Personal history of peptic ulcer disease: Secondary | ICD-10-CM

## 2013-01-08 DIAGNOSIS — Z87891 Personal history of nicotine dependence: Secondary | ICD-10-CM

## 2013-01-08 DIAGNOSIS — K560 Paralytic ileus: Secondary | ICD-10-CM | POA: Diagnosis not present

## 2013-01-08 DIAGNOSIS — D3000 Benign neoplasm of unspecified kidney: Principal | ICD-10-CM | POA: Diagnosis present

## 2013-01-08 DIAGNOSIS — F411 Generalized anxiety disorder: Secondary | ICD-10-CM | POA: Diagnosis present

## 2013-01-08 DIAGNOSIS — N3946 Mixed incontinence: Secondary | ICD-10-CM | POA: Diagnosis present

## 2013-01-08 DIAGNOSIS — Z01812 Encounter for preprocedural laboratory examination: Secondary | ICD-10-CM

## 2013-01-08 DIAGNOSIS — K5900 Constipation, unspecified: Secondary | ICD-10-CM | POA: Diagnosis present

## 2013-01-08 HISTORY — PX: ROBOT ASSISTED LAPAROSCOPIC NEPHRECTOMY: SHX5140

## 2013-01-08 LAB — BASIC METABOLIC PANEL
CO2: 29 mEq/L (ref 19–32)
Chloride: 101 mEq/L (ref 96–112)
Creatinine, Ser: 0.87 mg/dL (ref 0.50–1.10)
GFR calc Af Amer: 89 mL/min — ABNORMAL LOW (ref >90)
Sodium: 136 mEq/L (ref 135–145)

## 2013-01-08 LAB — HEMOGLOBIN AND HEMATOCRIT, BLOOD
HCT: 35.1 % — ABNORMAL LOW (ref 36.0–46.0)
Hemoglobin: 11.3 g/dL — ABNORMAL LOW (ref 12.0–15.0)

## 2013-01-08 SURGERY — ROBOTIC ASSISTED LAPAROSCOPIC NEPHRECTOMY
Anesthesia: General | Laterality: Right | Wound class: Clean

## 2013-01-08 MED ORDER — VITAMINS A & D EX OINT
TOPICAL_OINTMENT | CUTANEOUS | Status: AC
  Filled 2013-01-08: qty 5

## 2013-01-08 MED ORDER — CEFAZOLIN SODIUM-DEXTROSE 2-3 GM-% IV SOLR
INTRAVENOUS | Status: AC
  Filled 2013-01-08: qty 50

## 2013-01-08 MED ORDER — CEFAZOLIN SODIUM-DEXTROSE 2-3 GM-% IV SOLR: 2.0000 g | INTRAVENOUS | Status: DC

## 2013-01-08 MED ORDER — DIPHENHYDRAMINE HCL 12.5 MG/5ML PO ELIX
12.5000 mg | ORAL_SOLUTION | Freq: Four times a day (QID) | ORAL | Status: DC | PRN
  Administered 2013-01-10 – 2013-01-11 (×2): 25 mg via ORAL
  Filled 2013-01-08 (×2): qty 10

## 2013-01-08 MED ORDER — LORAZEPAM 2 MG/ML IJ SOLN
1.0000 mg | Freq: Once | INTRAMUSCULAR | Status: AC
  Administered 2013-01-08: 1 mg via INTRAVENOUS
  Filled 2013-01-08: qty 1

## 2013-01-08 MED ORDER — MORPHINE SULFATE 2 MG/ML IJ SOLN
2.0000 mg | INTRAMUSCULAR | Status: DC | PRN
  Administered 2013-01-08 (×3): 2 mg via INTRAVENOUS
  Administered 2013-01-08 – 2013-01-09 (×3): 4 mg via INTRAVENOUS
  Filled 2013-01-08: qty 1
  Filled 2013-01-08: qty 2
  Filled 2013-01-08: qty 1
  Filled 2013-01-08 (×5): qty 2

## 2013-01-08 MED ORDER — PROMETHAZINE HCL 25 MG/ML IJ SOLN: 6.2500 mg | INTRAMUSCULAR | Status: DC | PRN

## 2013-01-08 MED ORDER — DEXTROSE-NACL 5-0.45 % IV SOLN
INTRAVENOUS | Status: DC
Start: 2013-01-08 — End: 2013-01-10
  Administered 2013-01-08 – 2013-01-09 (×5): via INTRAVENOUS

## 2013-01-08 MED ORDER — HYDROMORPHONE HCL PF 1 MG/ML IJ SOLN
0.2500 mg | INTRAMUSCULAR | Status: DC | PRN
  Administered 2013-01-08 (×3): 0.5 mg via INTRAVENOUS

## 2013-01-08 MED ORDER — DOCUSATE SODIUM 100 MG PO CAPS
100.0000 mg | ORAL_CAPSULE | Freq: Two times a day (BID) | ORAL | Status: DC
  Administered 2013-01-08 – 2013-01-12 (×9): 100 mg via ORAL
  Filled 2013-01-08 (×10): qty 1

## 2013-01-08 MED ORDER — CEFAZOLIN SODIUM 1-5 GM-% IV SOLN
1.0000 g | Freq: Three times a day (TID) | INTRAVENOUS | Status: AC
  Administered 2013-01-08 (×2): 1 g via INTRAVENOUS
  Filled 2013-01-08 (×2): qty 50

## 2013-01-08 MED ORDER — LACTATED RINGERS IV SOLN: INTRAVENOUS | Status: DC

## 2013-01-08 MED ORDER — ACETAMINOPHEN 10 MG/ML IV SOLN
1000.0000 mg | Freq: Four times a day (QID) | INTRAVENOUS | Status: AC
  Administered 2013-01-08 – 2013-01-09 (×4): 1000 mg via INTRAVENOUS
  Filled 2013-01-08 (×4): qty 100

## 2013-01-08 MED ORDER — HYDROMORPHONE HCL PF 1 MG/ML IJ SOLN
INTRAMUSCULAR | Status: AC
  Filled 2013-01-08: qty 1

## 2013-01-08 MED ORDER — MORPHINE SULFATE 2 MG/ML IJ SOLN
INTRAMUSCULAR | Status: AC
  Administered 2013-01-08: 4 mg via INTRAVENOUS
  Filled 2013-01-08: qty 1

## 2013-01-08 MED ORDER — STERILE WATER FOR IRRIGATION IR SOLN
Status: DC | PRN
  Administered 2013-01-08: 3000 mL

## 2013-01-08 MED ORDER — DIPHENHYDRAMINE HCL 50 MG/ML IJ SOLN
12.5000 mg | Freq: Four times a day (QID) | INTRAMUSCULAR | Status: DC | PRN
  Administered 2013-01-08: 25 mg via INTRAVENOUS
  Administered 2013-01-09 – 2013-01-10 (×2): 12.5 mg via INTRAVENOUS
  Filled 2013-01-08 (×3): qty 1

## 2013-01-08 MED ORDER — BUPIVACAINE LIPOSOME 1.3 % IJ SUSP
20.0000 mL | Freq: Once | INTRAMUSCULAR | Status: DC
  Filled 2013-01-08: qty 20

## 2013-01-08 MED ORDER — MANNITOL 25 % IV SOLN
25.0000 g | INTRAVENOUS | Status: DC
  Filled 2013-01-08: qty 100

## 2013-01-08 MED ORDER — CLONAZEPAM 0.5 MG PO TABS
0.2500 mg | ORAL_TABLET | Freq: Two times a day (BID) | ORAL | Status: DC | PRN
  Administered 2013-01-08 – 2013-01-11 (×5): 0.25 mg via ORAL
  Filled 2013-01-08 (×5): qty 1

## 2013-01-08 MED ORDER — ONDANSETRON HCL 4 MG/2ML IJ SOLN
4.0000 mg | INTRAMUSCULAR | Status: DC | PRN
  Administered 2013-01-10: 4 mg via INTRAVENOUS
  Filled 2013-01-08: qty 2

## 2013-01-08 MED ORDER — PANTOPRAZOLE SODIUM 40 MG PO TBEC
40.0000 mg | DELAYED_RELEASE_TABLET | Freq: Every day | ORAL | Status: DC
  Administered 2013-01-09 – 2013-01-12 (×4): 40 mg via ORAL
  Filled 2013-01-08 (×5): qty 1

## 2013-01-08 MED ORDER — LACTATED RINGERS IR SOLN
Status: DC | PRN
  Administered 2013-01-08: 1000 mL

## 2013-01-08 MED ORDER — ZOLPIDEM TARTRATE 5 MG PO TABS
5.0000 mg | ORAL_TABLET | Freq: Every evening | ORAL | Status: DC | PRN
  Administered 2013-01-10: 5 mg via ORAL
  Filled 2013-01-08: qty 1

## 2013-01-08 MED ORDER — DICYCLOMINE HCL 20 MG PO TABS
20.0000 mg | ORAL_TABLET | Freq: Four times a day (QID) | ORAL | Status: DC
  Administered 2013-01-08 – 2013-01-12 (×16): 20 mg via ORAL
  Filled 2013-01-08 (×19): qty 1

## 2013-01-08 MED ORDER — BUPIVACAINE-EPINEPHRINE 0.25% -1:200000 IJ SOLN
INTRAMUSCULAR | Status: DC | PRN
  Administered 2013-01-08: 40 mL

## 2013-01-08 SURGICAL SUPPLY — 67 items
APPLICATOR SURGIFLO ENDO (HEMOSTASIS) ×2 IMPLANT
CANNULA SEAL DVNC (CANNULA) ×3 IMPLANT
CANNULA SEALS DA VINCI (CANNULA) ×3
CHLORAPREP W/TINT 26ML (MISCELLANEOUS) ×2 IMPLANT
CLIP LIGATING HEM O LOK PURPLE (MISCELLANEOUS) IMPLANT
CLIP LIGATING HEMO O LOK GREEN (MISCELLANEOUS) ×4 IMPLANT
CLIP SUT LAPRA TY ABSORB (SUTURE) IMPLANT
CLOTH BEACON ORANGE TIMEOUT ST (SAFETY) ×2 IMPLANT
CORDS BIPOLAR (ELECTRODE) ×2 IMPLANT
COVER SURGICAL LIGHT HANDLE (MISCELLANEOUS) ×2 IMPLANT
COVER TIP SHEARS 8 DVNC (MISCELLANEOUS) ×1 IMPLANT
COVER TIP SHEARS 8MM DA VINCI (MISCELLANEOUS) ×1
DECANTER SPIKE VIAL GLASS SM (MISCELLANEOUS) IMPLANT
DERMABOND ADVANCED (GAUZE/BANDAGES/DRESSINGS) ×1
DERMABOND ADVANCED .7 DNX12 (GAUZE/BANDAGES/DRESSINGS) ×1 IMPLANT
DRAIN CHANNEL 15F RND FF 3/16 (WOUND CARE) ×2 IMPLANT
DRAPE INCISE IOBAN 66X45 STRL (DRAPES) ×2 IMPLANT
DRAPE LAPAROSCOPIC ABDOMINAL (DRAPES) ×2 IMPLANT
DRAPE LG THREE QUARTER DISP (DRAPES) ×4 IMPLANT
DRAPE TABLE BACK 44X90 PK DISP (DRAPES) ×2 IMPLANT
DRAPE WARM FLUID 44X44 (DRAPE) ×2 IMPLANT
DRESSING SURGICEL FIBRLLR 1X2 (HEMOSTASIS) IMPLANT
DRSG SURGICEL FIBRILLAR 1X2 (HEMOSTASIS)
ELECT REM PT RETURN 9FT ADLT (ELECTROSURGICAL) ×2
ELECTRODE REM PT RTRN 9FT ADLT (ELECTROSURGICAL) ×1 IMPLANT
EVACUATOR SILICONE 100CC (DRAIN) ×2 IMPLANT
GAUZE VASELINE 3X9 (GAUZE/BANDAGES/DRESSINGS) IMPLANT
GLOVE BIOGEL M STRL SZ7.5 (GLOVE) ×4 IMPLANT
GOWN STRL NON-REIN LRG LVL3 (GOWN DISPOSABLE) ×8 IMPLANT
HEMOSTAT SURGICEL 4X8 (HEMOSTASIS) IMPLANT
KIT ACCESSORY DA VINCI DISP (KITS) ×1
KIT ACCESSORY DVNC DISP (KITS) ×1 IMPLANT
KIT BASIN OR (CUSTOM PROCEDURE TRAY) ×2 IMPLANT
MARKER SKIN DUAL TIP RULER LAB (MISCELLANEOUS) ×2 IMPLANT
NS IRRIG 1000ML POUR BTL (IV SOLUTION) ×2 IMPLANT
PENCIL BUTTON HOLSTER BLD 10FT (ELECTRODE) ×2 IMPLANT
POSITIONER SURGICAL ARM (MISCELLANEOUS) ×4 IMPLANT
POUCH SPECIMEN RETRIEVAL 10MM (ENDOMECHANICALS) ×2 IMPLANT
SET TUBE IRRIG SUCTION NO TIP (IRRIGATION / IRRIGATOR) ×2 IMPLANT
SOLUTION ANTI FOG 6CC (MISCELLANEOUS) ×2 IMPLANT
SOLUTION ELECTROLUBE (MISCELLANEOUS) ×2 IMPLANT
SPONGE LAP 18X18 X RAY DECT (DISPOSABLE) IMPLANT
SURGIFLO W/THROMBIN 8M KIT (HEMOSTASIS) ×2 IMPLANT
SUT ETHILON 3 0 PS 1 (SUTURE) ×2 IMPLANT
SUT MNCRL AB 4-0 PS2 18 (SUTURE) IMPLANT
SUT MON AB 2-0 SH 27 (SUTURE) IMPLANT
SUT MON AB 2-0 SH27 (SUTURE) IMPLANT
SUT V-LOC BARB 180 2/0GR6 GS22 (SUTURE) ×2
SUT VIC AB 0 CT1 27 (SUTURE) ×1
SUT VIC AB 0 CT1 27XBRD ANTBC (SUTURE) ×1 IMPLANT
SUT VIC AB 0 UR5 27 (SUTURE) IMPLANT
SUT VIC AB 2-0 SH 27 (SUTURE)
SUT VIC AB 2-0 SH 27X BRD (SUTURE) IMPLANT
SUT VIC AB 4-0 RB1 27 (SUTURE) ×2
SUT VIC AB 4-0 RB1 27XBRD (SUTURE) ×2 IMPLANT
SUT VICRYL 0 UR6 27IN ABS (SUTURE) ×4 IMPLANT
SUT VLOC BARB 180 ABS3/0GR12 (SUTURE) ×2
SUTURE V-LC BRB 180 2/0GR6GS22 (SUTURE) ×1 IMPLANT
SUTURE VLOC BRB 180 ABS3/0GR12 (SUTURE) ×1 IMPLANT
SYR BULB IRRIGATION 50ML (SYRINGE) IMPLANT
TOWEL OR NON WOVEN STRL DISP B (DISPOSABLE) ×2 IMPLANT
TRAY FOLEY CATH 14FRSI W/METER (CATHETERS) ×2 IMPLANT
TRAY LAP CHOLE (CUSTOM PROCEDURE TRAY) ×2 IMPLANT
TROCAR ENDOPATH XCEL 12X100 BL (ENDOMECHANICALS) ×2 IMPLANT
TROCAR XCEL 12X100 BLDLESS (ENDOMECHANICALS) ×2 IMPLANT
TUBING INSUFFLATION 10FT LAP (TUBING) ×2 IMPLANT
WATER STERILE IRR 1500ML POUR (IV SOLUTION) ×4 IMPLANT

## 2013-01-08 NOTE — Progress Notes (Signed)
Patient ID: Traci Johnston, female   DOB: August 15, 1960, 52 y.o.   MRN: [id number] Day of Surgery Post-op note  Subjective: The patient is doing well.  No complaints.  Mild soreness.   Denies N/V  Objective: Vital signs in last 24 hours: Temp:  [94.3 F (34.6 C)-98.4 F (36.9 C)] 98.3 F (36.8 C) (07/21 1233) Pulse Rate:  [51-76] 51 (07/21 1233) Resp:  [10-19] 14 (07/21 1233) BP: (102-135)/(64-75) 118/65 mmHg (07/21 1233) SpO2:  [96 %-100 %] 100 % (07/21 1233) Weight:  [73.483 kg (162 lb)] 73.483 kg (162 lb) (07/21 1400)  Intake/Output from previous day:   Intake/Output this shift: Total I/O In: 757.5 [I.V.:567.5; Other:40; IV Piggyback:150] Out: 110 [Urine:40; Drains:70]  Physical Exam:  General: Alert and oriented. Abdomen: Soft, Nondistended. Incisions: Clean and dry.  Lab Results:  Recent Labs  01/08/13 1042  HGB 11.3*  HCT 35.1*    Assessment/Plan: POD#0   1) Continue to monitor 2) bed rest, IS, pain control, clears, DVT prophy    LOS: 0 days   Silas Flood. 01/08/2013, 3:23 PM

## 2013-01-08 NOTE — Progress Notes (Addendum)
Patient in extreme pain , 10/10. Pain in shoulder and right flank. Patient is shaking. B/P 188/111 PCP notified.

## 2013-01-08 NOTE — Interval H&P Note (Signed)
History and Physical Interval Note:  01/08/2013 7:12 AM  Traci Johnston  has presented today for surgery, with the diagnosis of Right Renal Neoplasm  The various methods of treatment have been discussed with the patient and family. After consideration of risks, benefits and other options for treatment, the patient has consented to  Procedure(s) with comments: ROBOTIC ASSISTED LAPAROSCOPIC NEPHRECTOMY (Right) - PARTIAL NEPHRECTOMY POSSIBLE OPEN  as a surgical intervention .  The patient's history has been reviewed, patient examined, no change in status, stable for surgery.  I have reviewed the patient's chart and labs.  Questions were answered to the patient's satisfaction.      Jon,LES

## 2013-01-08 NOTE — Op Note (Signed)
Preoperative diagnosis: Right renal neoplasm  Postoperative diagnosis: Right renal neoplasm  Procedure:  1. Right robotic-assisted laparoscopic partial nephrectomy  Surgeon: Moody Bruins. M.D.  Assistant(s): Pecola Leisure, PA-C  Anesthesia: General  Complications: None  EBL: 100 mL  IVF:  3300 mL crystalloid  Specimens: 1. Right renal neoplasm  Disposition of specimens: Pathology  Intraoperative findings:       1. Warm renal ischemia time: 15 minutes  Drains: 1. # 15 Blake perinephric drain  Indication:  Traci Johnston is a 52 y.o. year old patient with a right renal neoplasm.  After a thorough review of the management options for their renal mass, they elected to proceed with surgical treatment and the above procedure.  We have discussed the potential benefits and risks of the procedure, side effects of the proposed treatment, the likelihood of the patient achieving the goals of the procedure, and any potential problems that might occur during the procedure or recuperation. Informed consent has been obtained.   Description of procedure:  The patient was taken to the operating room and a general anesthetic was administered. The patient was given preoperative antibiotics, placed in the right modified flank position with care to pad all potential pressure points, and prepped and draped in the usual sterile fashion. Next a preoperative timeout was performed.  A site was selected on the right side of the umbilicus for placement of the camera port. This was placed using a Johnston open Hassan technique which allowed entry into the peritoneal cavity under direct vision and without difficulty. A 12 mm port was placed and a pneumoperitoneum established. The camera was then used to inspect the abdomen and there was no evidence of any intra-abdominal injuries or other abnormalities. The remaining abdominal ports were then placed. 8 mm robotic ports were placed in the right  upper quadrant, right lower quadrant, and far right lateral abdominal wall. A 12 mm port was placed in the upper midline for laparoscopic assistance. All ports were placed under direct vision without difficulty. The surgical cart was then docked.   Utilizing the cautery scissors, the white line of Toldt was incised allowing the colon to be mobilized medially and the plane between the mesocolon and the anterior layer of Gerota's fascia to be developed and the kidney to be exposed.  The ureter and gonadal vein were identified inferiorly and the ureter was lifted anteriorly off the psoas muscle.  Dissection proceeded superiorly along the gonadal vein until the renal vein was identified.  The renal hilum was then carefully isolated with a combination of blunt and sharp dissection allowing the renal arterial and venous structures to be separated and isolated in preparation for renal hilar vessel clamping. There was a single renal vein and a single renal artery.  12.5 g of IV mannitol was then administered.   Attention turned to the kidney and the perinephric fat surrounding the renal mass was removed and the kidney was mobilized sufficiently for exposure and resection of the renal mass. The tumor was located off the posterior aspect of the lower pole of the kidney.  This required the kidney to be completely mobilized and flipped over to expose the posterior surface.  Once the renal mass was properly isolated, preparations were made for resection of the tumor.  Reconstructive sutures were placed into the abdomen for the renorrhaphy portion of the procedure.  The renal artery was then clamped with bulldog clamps.  The tumor was then excised with cold scissor dissection along with an  adequate visible gross margin of normal renal parenchyma. The tumor appeared to be excised without any gross violation of the tumor. The renal collecting system was entered during removal of the tumor.  A running 3-0 V-lock suture was  then brought through the capsule of the kidney and run along the base of the renal defect to provide hemostasis and close any entry into the renal collecting system if present. Weck clips were used to secure this suture outside the renal capsule at the proximal and distal ends. An additional hemostatic agent (Surgiflo) was then placed into the renal defect. A running 2-0 V lock suture was then used to close the capsule of the kidney using a sliding clip technique which resulted in excellent hemostasis.    The bulldog clamps were then removed from the renal hilar vessel(s) and an additional 12.5 g of IV mannitol was administered. Total warm renal ischemia time was 15 minutes. The renal tumor resection site was examined. Hemostasis appeared adequate.   The kidney was placed back into its normal anatomic position and covered with perinephric fat as needed.  A # 15 Blake drain was then brought through the lateral lower port site and positioned in the perinephric space.  It was secured to the skin with a nylon suture. The surgical cart was undocked.  The renal tumor specimen was removed intact within an endopouch retrieval bag via the camera port sites.  The camera port site and the other 12 mm port site were then closed at the fascial level with 0-vicryl suture.  All other laparoscopic/robotic ports were removed under direct vision and the pneumoperitoneum let down with inspection of the operative field performed and hemostasis again confirmed. All incision sites were then injected with local anesthetic and reapproximated at the skin level with 4-0 monocryl subcuticular closures.  Dermabond was applied to the skin.  The patient tolerated the procedure well and without complications.  The patient was able to be extubated and transferred to the recovery unit in satisfactory condition.  Moody Bruins MD

## 2013-01-08 NOTE — Anesthesia Preprocedure Evaluation (Addendum)
Anesthesia Evaluation  Patient identified by MRN, date of birth, ID band Patient awake    Reviewed: Allergy & Precautions, H&P , NPO status , Patient's Chart, lab work & pertinent test results  Airway Mallampati: II TM Distance: >3 FB Neck ROM: Full    Dental  (+) Edentulous Upper and Edentulous Lower   Pulmonary neg pulmonary ROS, former smoker,  breath sounds clear to auscultation  Pulmonary exam normal       Cardiovascular negative cardio ROS  Rhythm:Regular Rate:Normal     Neuro/Psych negative neurological ROS  negative psych ROS   GI/Hepatic Neg liver ROS, GERD-  ,IBS   Endo/Other  negative endocrine ROS  Renal/GU negative Renal ROS  negative genitourinary   Musculoskeletal negative musculoskeletal ROS (+)   Abdominal   Peds negative pediatric ROS (+)  Hematology negative hematology ROS (+)   Anesthesia Other Findings   Reproductive/Obstetrics negative OB ROS                          Anesthesia Physical Anesthesia Plan  ASA: III  Anesthesia Plan: General   Post-op Pain Management:    Induction: Intravenous  Airway Management Planned: Oral ETT  Additional Equipment:   Intra-op Plan:   Post-operative Plan: Extubation in OR  Informed Consent: I have reviewed the patients History and Physical, chart, labs and discussed the procedure including the risks, benefits and alternatives for the proposed anesthesia with the patient or authorized representative who has indicated his/her understanding and acceptance.   Dental advisory given  Plan Discussed with: CRNA  Anesthesia Plan Comments:         Anesthesia Quick Evaluation

## 2013-01-08 NOTE — Telephone Encounter (Signed)
Pt in hospital for surgery today.

## 2013-01-09 ENCOUNTER — Encounter (HOSPITAL_COMMUNITY): Payer: Self-pay | Admitting: Urology

## 2013-01-09 LAB — HEMOGLOBIN AND HEMATOCRIT, BLOOD: HCT: 32.1 % — ABNORMAL LOW (ref 36.0–46.0)

## 2013-01-09 LAB — BASIC METABOLIC PANEL
BUN: 6 mg/dL (ref 6–23)
CO2: 31 mEq/L (ref 19–32)
Chloride: 101 mEq/L (ref 96–112)
Glucose, Bld: 131 mg/dL — ABNORMAL HIGH (ref 70–99)
Potassium: 3.7 mEq/L (ref 3.5–5.1)
Sodium: 137 mEq/L (ref 135–145)

## 2013-01-09 MED ORDER — OXYCODONE-ACETAMINOPHEN 5-325 MG PO TABS
1.0000 | ORAL_TABLET | Freq: Four times a day (QID) | ORAL | Status: DC | PRN
  Administered 2013-01-09 – 2013-01-10 (×4): 2 via ORAL
  Administered 2013-01-10: 1 via ORAL
  Administered 2013-01-10 – 2013-01-11 (×5): 2 via ORAL
  Administered 2013-01-12: 1 via ORAL
  Administered 2013-01-12: 2 via ORAL
  Filled 2013-01-09 (×12): qty 2
  Filled 2013-01-09: qty 1

## 2013-01-09 MED ORDER — BISACODYL 10 MG RE SUPP
10.0000 mg | Freq: Once | RECTAL | Status: AC
  Administered 2013-01-09: 10 mg via RECTAL
  Filled 2013-01-09: qty 1

## 2013-01-09 MED ORDER — HYDROMORPHONE HCL PF 1 MG/ML IJ SOLN
1.0000 mg | INTRAMUSCULAR | Status: DC | PRN
  Administered 2013-01-09 – 2013-01-10 (×4): 1 mg via INTRAVENOUS
  Filled 2013-01-09 (×4): qty 1

## 2013-01-09 NOTE — Progress Notes (Signed)
07222014/Rhonda Davis, RN,BSN,CCM: Case management [phone] Chart reviewed and updated.  Next chart review due on 07252014. Needs for discharge at time of review:  None 

## 2013-01-09 NOTE — Progress Notes (Signed)
The patient c/o pain in shoulder. Heat packs were used on the left shoulder and pain medication was administered. Patient was assisted in being repositioned. Will continue to monitor patient.

## 2013-01-09 NOTE — Progress Notes (Signed)
Patient was assisted with repositioning. Warm therapy in the form of "hot packs" were administered to left shoulder and abdomen. Warm blankets were placed on the patient. Pain medication was administered. Will continue to monitor the patient.

## 2013-01-09 NOTE — Progress Notes (Signed)
Went to visit patient today at the request of nursing staff. Patient was sleeping, so I did not want to bother her.

## 2013-01-09 NOTE — Progress Notes (Signed)
HS temp was 101.0, gave her 2 percocet and she walked the halls again.  She had a BM - moderate, brown stool and temp went down to 98.7.

## 2013-01-09 NOTE — Progress Notes (Signed)
Patient ID: Traci Johnston, female   DOB: 11-29-1960, 52 y.o.   MRN: [id number]  1 Day Post-Op Subjective: The patient is doing well.  No nausea or vomiting. Pain is adequately controlled. Pt had one episode of chills yesterday but no fever.  Objective: Vital signs in last 24 hours: Temp:  [94.3 F (34.6 C)-98.5 F (36.9 C)] 98.4 F (36.9 C) (07/22 0554) Pulse Rate:  [51-84] 57 (07/22 0554) Resp:  [10-20] 14 (07/22 0554) BP: (102-180)/(64-111) 112/68 mmHg (07/22 0554) SpO2:  [96 %-100 %] 100 % (07/22 0554) Weight:  [73.483 kg (162 lb)] 73.483 kg (162 lb) (07/21 1400)  Intake/Output from previous day: 07/21 0701 - 07/22 0700 In: 3707.5 [P.O.:720; I.V.:2517.5; IV Piggyback:400] Out: 2240 [Urine:2090; Drains:150] Intake/Output this shift:    Physical Exam:  General: Alert and oriented. CV: RRR Lungs: Clear bilaterally. GI: Soft, Nondistended. Incisions: Clean and dry. Urine: Clear Extremities: Nontender, no erythema, no edema.  Lab Results:  Recent Labs  01/08/13 1042 01/09/13 0425  HGB 11.3* 10.3*  HCT 35.1* 32.1*          Recent Labs  01/03/13 1110 01/08/13 1042 01/09/13 0425  CREATININE 0.85 0.87 0.97           Results for orders placed during the hospital encounter of 01/08/13 (from the past 24 hour(s))  BASIC METABOLIC PANEL     Status: Abnormal   Collection Time    01/08/13 10:42 AM      Result Value Range   Sodium 136  135 - 145 mEq/L   Potassium 3.8  3.5 - 5.1 mEq/L   Chloride 101  96 - 112 mEq/L   CO2 29  19 - 32 mEq/L   Glucose, Bld 136 (*) 70 - 99 mg/dL   BUN 8  6 - 23 mg/dL   Creatinine, Ser 4.09  0.50 - 1.10 mg/dL   Calcium 8.8  8.4 - 81.1 mg/dL   GFR calc non Af Amer 76 (*) >90 mL/min   GFR calc Af Amer 89 (*) >90 mL/min  HEMOGLOBIN AND HEMATOCRIT, BLOOD     Status: Abnormal   Collection Time    01/08/13 10:42 AM      Result Value Range   Hemoglobin 11.3 (*) 12.0 - 15.0 g/dL   HCT 91.4 (*) 78.2 - 95.6 %  BASIC METABOLIC PANEL      Status: Abnormal   Collection Time    01/09/13  4:25 AM      Result Value Range   Sodium 137  135 - 145 mEq/L   Potassium 3.7  3.5 - 5.1 mEq/L   Chloride 101  96 - 112 mEq/L   CO2 31  19 - 32 mEq/L   Glucose, Bld 131 (*) 70 - 99 mg/dL   BUN 6  6 - 23 mg/dL   Creatinine, Ser 2.13  0.50 - 1.10 mg/dL   Calcium 8.3 (*) 8.4 - 10.5 mg/dL   GFR calc non Af Amer 67 (*) >90 mL/min   GFR calc Af Amer 78 (*) >90 mL/min  HEMOGLOBIN AND HEMATOCRIT, BLOOD     Status: Abnormal   Collection Time    01/09/13  4:25 AM      Result Value Range   Hemoglobin 10.3 (*) 12.0 - 15.0 g/dL   HCT 08.6 (*) 57.8 - 46.9 %    Assessment/Plan: POD# 1 s/p robotic partial nephrectomy.  1) Ambulate, Incentive spirometry 2) Advance diet as tolerated 3) Transition to oral pain medication 4) Dulcolax suppository 5)  D/C urethral catheter   Moody Bruins. MD   LOS: 1 day   Kemper Heupel,LES 01/09/2013, 7:15 AM

## 2013-01-09 NOTE — Progress Notes (Signed)
Pt has walked in hallway x6 today - continues to have pain of 8/10 without pain meds and 5/10 with meds, vss, has not had stool and has not passed gas but does have bowel sounds. Tolerating clear liquids but doesn't feel like a higher diet yet.  Jp draining small amounts of SS fluid

## 2013-01-09 NOTE — Progress Notes (Addendum)
Patient's B?P returned to WNL. B/P was 119/65 Patient was calmer and was no longer shaking or crying. MD on call was notified of the B/P

## 2013-01-10 LAB — BASIC METABOLIC PANEL
CO2: 29 mEq/L (ref 19–32)
Chloride: 101 mEq/L (ref 96–112)
Glucose, Bld: 103 mg/dL — ABNORMAL HIGH (ref 70–99)
Potassium: 3.7 mEq/L (ref 3.5–5.1)
Sodium: 137 mEq/L (ref 135–145)

## 2013-01-10 LAB — CREATININE, FLUID (PLEURAL, PERITONEAL, JP DRAINAGE): Creat, Fluid: 1 mg/dL

## 2013-01-10 LAB — HEMOGLOBIN AND HEMATOCRIT, BLOOD
HCT: 32.1 % — ABNORMAL LOW (ref 36.0–46.0)
Hemoglobin: 10.3 g/dL — ABNORMAL LOW (ref 12.0–15.0)

## 2013-01-10 MED ORDER — OXYCODONE-ACETAMINOPHEN 10-325 MG PO TABS: ORAL_TABLET | ORAL | Status: DC

## 2013-01-10 MED FILL — Glycopyrrolate Inj 0.2 MG/ML: INTRAMUSCULAR | Qty: 1 | Status: AC

## 2013-01-10 MED FILL — Ephedrine Sulf-NaCl PF Pref Syr 50 MG/10ML-0.9% (5 MG/ML): INTRAVENOUS | Qty: 1 | Status: AC

## 2013-01-10 MED FILL — Rocuronium Bromide IV Soln 50 MG/5ML (10 MG/ML): INTRAVENOUS | Qty: 4 | Status: AC

## 2013-01-10 MED FILL — Ondansetron HCl Inj 4 MG/2ML (2 MG/ML): INTRAMUSCULAR | Qty: 2 | Status: AC

## 2013-01-10 MED FILL — Sufentanil Citrate Inj 50 MCG/ML: INTRAVENOUS | Qty: 1 | Status: AC

## 2013-01-10 MED FILL — Propofol IV Emul 200 MG/20ML (10 MG/ML): INTRAVENOUS | Qty: 20 | Status: AC

## 2013-01-10 MED FILL — Neostigmine Methylsulfate Inj 1 MG/ML: INTRAMUSCULAR | Qty: 5 | Status: AC

## 2013-01-10 NOTE — Progress Notes (Signed)
Patient ID: Traci Johnston, female   DOB: 28-Dec-1960, 52 y.o.   MRN: [id number]  2 Days Post-Op Subjective: The patient is doing well. She did have increased abdominal pain last night but did have bowel movement with some relief.  Still with some abdominal pain.  No nausea or vomiting. Pain is adequately controlled currently.  Objective: Vital signs in last 24 hours: Temp:  [98.7 F (37.1 C)-101 F (38.3 C)] 100.4 F (38 C) (07/23 0454) Pulse Rate:  [69-87] 87 (07/23 0454) Resp:  [16-20] 20 (07/23 0454) BP: (118-138)/(60-81) 118/60 mmHg (07/23 0454) SpO2:  [97 %-99 %] 97 % (07/23 0454)  Intake/Output from previous day: 07/22 0701 - 07/23 0700 In: 4525 [P.O.:2180; I.V.:2345] Out: 2865 [Urine:2800; Drains:65] Intake/Output this shift:    Physical Exam:  General: Alert and oriented. CV: RRR Lungs: Clear bilaterally. GI: Soft, Nondistended. Incisions: Clean and dry. Urine: Clear Extremities: Nontender, no erythema, no edema.  Lab Results:  Recent Labs  01/08/13 1042 01/09/13 0425 01/10/13 0438  HGB 11.3* 10.3* 10.3*  HCT 35.1* 32.1* 32.1*          Recent Labs  01/08/13 1042 01/09/13 0425 01/10/13 0438  CREATININE 0.87 0.97 1.02           Results for orders placed during the hospital encounter of 01/08/13 (from the past 24 hour(s))  BASIC METABOLIC PANEL     Status: Abnormal   Collection Time    01/10/13  4:38 AM      Result Value Range   Sodium 137  135 - 145 mEq/L   Potassium 3.7  3.5 - 5.1 mEq/L   Chloride 101  96 - 112 mEq/L   CO2 29  19 - 32 mEq/L   Glucose, Bld 103 (*) 70 - 99 mg/dL   BUN 5 (*) 6 - 23 mg/dL   Creatinine, Ser 4.09  0.50 - 1.10 mg/dL   Calcium 8.7  8.4 - 81.1 mg/dL   GFR calc non Af Amer 63 (*) >90 mL/min   GFR calc Af Amer 73 (*) >90 mL/min  HEMOGLOBIN AND HEMATOCRIT, BLOOD     Status: Abnormal   Collection Time    01/10/13  4:38 AM      Result Value Range   Hemoglobin 10.3 (*) 12.0 - 15.0 g/dL   HCT 91.4 (*) 78.2 - 95.6 %    Path: Pending.   Assessment/Plan: POD# 2 s/p robotic partial nephrectomy.  1) Ambulate, Incentive spirometry 2) Check drain creatinine level 3) Sl IVF 4) D/C home if doing better later today.  Rolly Salter, Montez Hageman. MD   LOS: 2 days   Brinlyn Cena,LES 01/10/2013, 8:21 AM

## 2013-01-10 NOTE — Transfer of Care (Signed)
Immediate Anesthesia Transfer of Care Note  Patient: Traci Johnston  Procedure(s) Performed: Procedure(s) with comments: ROBOTIC ASSISTED LAPAROSCOPIC NEPHRECTOMY (Right) - PARTIAL NEPHRECTOMY    Patient Location: PACU  Anesthesia Type:General  Level of Consciousness: awake and oriented  Airway & Oxygen Therapy: Patient Spontanous Breathing and Patient connected to face mask  Post-op Assessment: Report given to PACU RN and Post -op Vital signs reviewed and stable  Post vital signs: stable  Complications: No apparent anesthesia complications

## 2013-01-10 NOTE — Progress Notes (Signed)
Patient is complaining of severe pain and has been very anxious calling out multiple times.  I have given her all of her prn medications for pain and anxiety.  About 30 minutes after giving her Percocet's she was crying from severe pain. She is requesting for IV pain medication, I explained we need to give the pain meds time to start working.  Will continue to monitor and follow up once the pain medicine has had a chance to start working.  Ernesta Amble, RN

## 2013-01-10 NOTE — Progress Notes (Signed)
Patient ID: Traci Johnston, female   DOB: [birth year], 52 y.o.   MRN: [id number]  Pt is doing ok although still with significant abdominal pain.  She is slightly more distended this afternoon and has been able to tolerate only a small portion of her meals.   Pathology: Oncocytoma (discussed with patient)  Drain Cr 1.0 (drain removed)  Will re-evaluate in AM.  If still with abdominal pain and distention, will check abdominal films.

## 2013-01-11 ENCOUNTER — Inpatient Hospital Stay (HOSPITAL_COMMUNITY): Payer: BC Managed Care – PPO

## 2013-01-11 LAB — COMPREHENSIVE METABOLIC PANEL
Albumin: 2.8 g/dL — ABNORMAL LOW (ref 3.5–5.2)
Alkaline Phosphatase: 73 U/L (ref 39–117)
BUN: 7 mg/dL (ref 6–23)
CO2: 27 mEq/L (ref 19–32)
Chloride: 100 mEq/L (ref 96–112)
Creatinine, Ser: 0.95 mg/dL (ref 0.50–1.10)
GFR calc Af Amer: 80 mL/min — ABNORMAL LOW (ref >90)
GFR calc non Af Amer: 69 mL/min — ABNORMAL LOW (ref >90)
Glucose, Bld: 100 mg/dL — ABNORMAL HIGH (ref 70–99)
Potassium: 4 mEq/L (ref 3.5–5.1)
Total Bilirubin: 0.3 mg/dL (ref 0.3–1.2)

## 2013-01-11 LAB — CBC WITH DIFFERENTIAL/PLATELET
Basophils Relative: 0 % (ref 0–1)
HCT: 32.6 % — ABNORMAL LOW (ref 36.0–46.0)
Hemoglobin: 10.4 g/dL — ABNORMAL LOW (ref 12.0–15.0)
Lymphs Abs: 2.5 10*3/uL (ref 0.7–4.0)
MCHC: 31.9 g/dL (ref 30.0–36.0)
Monocytes Absolute: 0.7 10*3/uL (ref 0.1–1.0)
Monocytes Relative: 7 % (ref 3–12)
Neutro Abs: 7.2 10*3/uL (ref 1.7–7.7)
Neutrophils Relative %: 69 % (ref 43–77)
RBC: 3.75 MIL/uL — ABNORMAL LOW (ref 3.87–5.11)

## 2013-01-11 MED ORDER — KCL IN DEXTROSE-NACL 20-5-0.9 MEQ/L-%-% IV SOLN
INTRAVENOUS | Status: DC
  Administered 2013-01-11 (×2): via INTRAVENOUS
  Filled 2013-01-11 (×3): qty 1000

## 2013-01-11 MED ORDER — FLEET ENEMA 7-19 GM/118ML RE ENEM
1.0000 | ENEMA | Freq: Once | RECTAL | Status: AC
  Administered 2013-01-11: 1 via RECTAL
  Filled 2013-01-11: qty 1

## 2013-01-11 MED ORDER — MAGNESIUM HYDROXIDE 400 MG/5ML PO SUSP
15.0000 mL | Freq: Once | ORAL | Status: AC
  Administered 2013-01-11: 15 mL via ORAL
  Filled 2013-01-11: qty 60

## 2013-01-11 MED ORDER — HYDROMORPHONE HCL PF 1 MG/ML IJ SOLN
1.0000 mg | INTRAMUSCULAR | Status: DC | PRN
  Administered 2013-01-11 – 2013-01-12 (×5): 1 mg via INTRAVENOUS
  Filled 2013-01-11 (×5): qty 1

## 2013-01-11 NOTE — Progress Notes (Signed)
Patient ID: Traci Johnston, female   DOB: 06-15-1961, 51 y.o.   MRN: [id number]  3 Days Post-Op Subjective: Pt still complains of abdominal pain. She has not had flatus or BM since 24 hrs ago.  No vomiting.  Mild intermittent nausea.   Objective: Vital signs in last 24 hours: Temp:  [97.9 F (36.6 C)-100 F (37.8 C)] 100 F (37.8 C) (07/24 0437) Pulse Rate:  [73-95] 95 (07/24 0437) Resp:  [17-18] 18 (07/24 0437) BP: (112-132)/(70-87) 132/87 mmHg (07/24 0437) SpO2:  [94 %-99 %] 94 % (07/24 0437)  Intake/Output from previous day: 07/23 0701 - 07/24 0700 In: 460 [P.O.:460] Out: 1410 [Urine:1400; Drains:10] Intake/Output this shift:    Physical Exam:  General: Alert and oriented CV: RRR Lungs: Clear Abdomen: More distended, tender throughout, no rebound tenderness or guarding, minimal BS Incisions: C/D/I Ext: NT, No erythema  Lab Results:  Recent Labs  01/08/13 1042 01/09/13 0425 01/10/13 0438  HGB 11.3* 10.3* 10.3*  HCT 35.1* 32.1* 32.1*   BMET  Recent Labs  01/09/13 0425 01/10/13 0438  NA 137 137  K 3.7 3.7  CL 101 101  CO2 31 29  GLUCOSE 131* 103*  BUN 6 5*  CREATININE 0.97 1.02  CALCIUM 8.3* 8.7     Studies/Results: No results found.  Assessment/Plan: POD # 3 s/p partial nephrectomy with post-op ileus - Restart IVF - Check AAS - CMP, CBC - NPO - Reglan, Dulcolax supp   LOS: 3 days   [name],LES 01/11/2013, 7:38 AM

## 2013-01-11 NOTE — Progress Notes (Signed)
Epic not functioning at time of surgical procedure. Paper chart documentation of anesthetic procedure.

## 2013-01-11 NOTE — Progress Notes (Signed)
Patient is resting now, daughter says she has anxiety attacks and she is scared about going home tomorrow. I will continue to monitor patient and keep her as comfortable as possible.  Ernesta Amble, RN

## 2013-01-11 NOTE — Progress Notes (Signed)
Visited with patient today. She says she has been anxious and having panic attacks and has never had those before. She relates that she is still anxious about her surgery. Listened, we tried some deep breathing exercises. Patient relates that her family is a "praying family". We talked about taking one day at a time and not concentrating on the future or the past, that sometimes taking too much in can lead to feeling ill at ease. She and I talked about her supportive family and that she is surrounded by people here who are providing excellent care. I explained about spiritual services and said they are available to her at any time, all she need do is ask her nurse to see the chaplain. We talked about breaking her day down into little manageable parts and staying in the present as much as possible. We prayed for peace and calm and for the presence and strength of the Lord to be with her. Her family is coming in later and she relates that they will have "church". This gives her peace. Will have other chaplains stop in to see her.

## 2013-01-11 NOTE — Progress Notes (Signed)
Patient ID: Traci Johnston, female   DOB: May 21, 1961, 52 y.o.   MRN: [id number]  3 Days Post-Op Subjective: Pt feeling somewhat better.  She has passed a small amount of flatus. No BM.  Pain improved.  Objective: Vital signs in last 24 hours: Temp:  [98.3 F (36.8 C)-100 F (37.8 C)] 98.3 F (36.8 C) (07/24 1417) Pulse Rate:  [73-95] 73 (07/24 1417) Resp:  [18-19] 19 (07/24 1417) BP: (101-132)/(73-87) 101/73 mmHg (07/24 1417) SpO2:  [94 %-99 %] 97 % (07/24 1417)  Intake/Output from previous day: 07/23 0701 - 07/24 0700 In: 460 [P.O.:460] Out: 1410 [Urine:1400; Drains:10] Intake/Output this shift:    Physical Exam:   Abdomen: Slightly less distended compared to this morning. Positive BS.   Lab Results:  Recent Labs  01/09/13 0425 01/10/13 0438 01/11/13 0900  HGB 10.3* 10.3* 10.4*  HCT 32.1* 32.1* 32.6*   BMET  Recent Labs  01/10/13 0438 01/11/13 0900  NA 137 136  K 3.7 4.0  CL 101 100  CO2 29 27  GLUCOSE 103* 100*  BUN 5* 7  CREATININE 1.02 0.95  CALCIUM 8.7 9.0     Studies/Results: Dg Abd Acute W/chest  01/11/2013   *RADIOLOGY REPORT*  Clinical Data: Abdominal pain and distention.  Post partial nephrectomy.  ACUTE ABDOMEN SERIES (ABDOMEN 2 VIEW & CHEST 1 VIEW)  Comparison: 12/15/2012  Findings: There is dextroscoliosis of the thoracic spine.  There are new streaky densities in the right mid and lower lung region. Few linear densities in the left mid lung region.  Stable appearance of the heart and mediastinum.  No evidence of free air. Gaseous distention of the colon with a large amount of stool in the pelvis.  Surgical clips in the right upper abdomen.  IMPRESSION: Gaseous distention of the colon with a large amount of stool in the pelvis.  Findings may represent constipation.  New streaky densities in the right lower lung region.  Findings could represent atelectasis but cannot exclude a developing infectious process.   Original Report Authenticated By: Traci Johnston, M.D.    Assessment/Plan: - Advance diet considering bowel function appears to be resolving and AAS does not appear to indicate evidence of bowel obstruction or small bowel ileus.  Will administer enema and laxative. The amount of stool in the pelvis makes me suspicious that she is chronically constipated. Possible D/C home tomorrow.   LOS: 3 days   Traci Johnston,LES 01/11/2013, 2:50 PM

## 2013-01-12 MED ORDER — POLYETHYLENE GLYCOL 3350 17 G PO PACK
17.0000 g | PACK | Freq: Every day | ORAL | Status: DC
  Administered 2013-01-12: 17 g via ORAL
  Filled 2013-01-12: qty 1

## 2013-01-12 NOTE — Discharge Summary (Signed)
Date of admission: 01/08/2013  Date of discharge: 01/12/2013  Admission diagnosis: Right renal mass  Discharge diagnosis: Oncocytoma  Secondary diagnoses: GERD, gastric ulcer, anxiety  History and Physical: For full details, please see admission history and physical. Briefly, Traci Johnston is a 52 y.o. year old patient who developed postprandial pain and mild nausea prompting an abdominal ultrasound in June 2014 by Dr. Rhea Belton. This incidentally demonstrated a 1.8 cm solid lower pole right renal mass. Further evaluation with an MRI of the abdomen confirmed a 1.9 x 1.8 cm enhancing mass of the posterior aspect of the lower pole of the right kidney concerning for renal cell carcinoma. The mass is 50% exophytic. There is no regional lymphadenopathy, adrenal masses, or concerning contralteral renal masses. There is no renal vein/IVC involvement and no other evidence of metastatic disease in the abdomen. She did have a few hepatic cysts which appear benign. Her chest x-ray was negative for metastatic disease. Her presenting symptoms have not had a clear etiology although appear to be from a GI source. She continues to have intermittent generalized abdominal pain and she will infrequently take hydrocodone for pain relief. Although she did have some dilation of her CBD, this did not appear concerning on an MRCP. She has prevously undergone a cholecystectomy, hysterectomy, and appendectomy. She has denied hematuria or flank pain. There is no family history of ESRD or kidney cancer.  Her prior surgical history includes a history of an open appendectomy, open cholecystectomy with an upper midline incision, and Pfannenstiel incision for a hysterectomy for benign reasons.  Her baseline renal function is normal with a creatinine of 0.9 (eGFR 88 ml/min) on 12/08/12. Her LFTs were also normal.    Hospital Course: Pt was admitted and taken to the OR on 01/08/13 for right robotic assisted laparoscopic partial nephrectomy.   Pt tolerated the procedure well and was hemodynamically stable immediately post op.  She was extubated without complication and woke up from anesthesia neurologically intact.  Pt has had issues with anxiety throughout the post op course but she has responded to her home dose anti anxiety medication.  Her foley was removed on POD 1 and she has been able to void without difficulty.  She developed abdominal pain and distention and an acute abdominal series confirmed constipation and bowel gas. The post operative ileus was treated with bowel rest, an enema, and laxatives.  She was able to pass flatus but has not had a significant bowel movement yet.  JP drain Cr was consistent with serum, therefore, the drain was removed without complication.  She is currently tolerating a regular diet and ambulating well.  She is doing well and felt stable for d/c home on POD 4.   Laboratory values:  Recent Labs  01/10/13 0438 01/11/13 0900  HGB 10.3* 10.4*  HCT 32.1* 32.6*    Recent Labs  01/10/13 0438 01/11/13 0900  CREATININE 1.02 0.95    Disposition: Home  Discharge instruction: The patient was instructed to be ambulatory but told to refrain from heavy lifting, strenuous activity, or driving.   Discharge medications:    Medication List    STOP taking these medications       ibuprofen 200 MG tablet  Commonly known as:  ADVIL,MOTRIN      TAKE these medications       clonazePAM 0.5 MG tablet  Commonly known as:  KLONOPIN  Take one tablet by mouth every 12 hours as needed for anxiety     dicyclomine 20 MG  tablet  Commonly known as:  BENTYL  Take 20 mg by mouth 4 (four) times daily. As directed     hyoscyamine 0.375 MG 12 hr capsule  Commonly known as:  LEVSINEX  Take 0.375 mg by mouth 2 (two) times daily as needed.     lansoprazole 30 MG capsule  Commonly known as:  PREVACID  Take 30 mg by mouth 2 (two) times daily.     MINIVELLE 0.1 MG/24HR  Generic drug:  estradiol  Place 1 patch  onto the skin every other day. As directed     oxyCODONE-acetaminophen 10-325 MG per tablet  Commonly known as:  PERCOCET  Take one tab every 4-6 hours as needed for abdominal pain.        Followup:      Follow-up Information   Follow up with Crecencio Mc, MD On 02/06/2013. (at 10:30)    Contact information:   [address] [place] [address] [phone]

## 2013-01-12 NOTE — Progress Notes (Signed)
Patient ID: Traci Johnston, female   DOB: 1961-02-11, 52 y.o.   MRN: [id number]  4 Days Post-Op Subjective: Pt feeling better but still has some abdominal pain. She had increased amounts of flatus last night after enema.  She continues to ambulate and has tolerated po intake overnight.  She still is very hesitant and anxious about going home.  Objective: Vital signs in last 24 hours: Temp:  [98.3 F (36.8 C)] 98.3 F (36.8 C) (07/25 0600) Pulse Rate:  [69-73] 69 (07/25 0600) Resp:  [18-20] 18 (07/25 0600) BP: (101-125)/(73-75) 109/73 mmHg (07/25 0600) SpO2:  [97 %-99 %] 99 % (07/25 0600)  Intake/Output from previous day: 07/24 0701 - 07/25 0700 In: 691.3 [I.V.:691.3] Out: 925 [Urine:925] Intake/Output this shift: Total I/O In: -  Out: 625 [Urine:625]  Physical Exam:  General: Alert and oriented CV: RRR Lungs: Clear Abdomen: Soft, Much less distention, Normal bowel sounds Incisions: C/D/I Ext: NT, No erythema  Lab Results:  Recent Labs  01/10/13 0438 01/11/13 0900  HGB 10.3* 10.4*  HCT 32.1* 32.6*   BMET  Recent Labs  01/10/13 0438 01/11/13 0900  NA 137 136  K 3.7 4.0  CL 101 100  CO2 29 27  GLUCOSE 103* 100*  BUN 5* 7  CREATININE 1.02 0.95  CALCIUM 8.7 9.0      Assessment/Plan: Pt improved but still very anxious and hesitant to go home.  Will place on regular diet, po only pain medications, and will administer oral laxative today.  Will reassess for discharge later today.  If not ready for D/C later today, will likely plan for D/C tomorrow.   LOS: 4 days   Traci Johnston,LES 01/12/2013, 7:00 AM

## 2013-01-15 ENCOUNTER — Telehealth: Payer: Self-pay | Admitting: *Deleted

## 2013-01-15 MED ORDER — CLONAZEPAM 0.5 MG PO TABS: ORAL_TABLET | ORAL | Status: DC

## 2013-01-15 MED ORDER — DICYCLOMINE HCL 20 MG PO TABS: 20.0000 mg | ORAL_TABLET | Freq: Four times a day (QID) | ORAL | Status: DC

## 2013-01-15 NOTE — Telephone Encounter (Signed)
Pt reports she had her surgery and it wasn't cancer; path shows ONCOCYTOMA. She reports no BM since last Tuesday' called DR Borden's of and was instructed to use a stool softener on 1st call and to use Senekot and Dulcolax on the 2nd call w/o any results. She also reports panic attacks in the hospital and she had one at home yesterday; she only has one left. What can pt do for constipation and can we fill Klonopin one more time until she sees her PCP; gave her 20 tabs on 12/15/12? You cal hear the anxiety in her voice. Thanks.

## 2013-01-15 NOTE — Telephone Encounter (Signed)
One bottle of magnesium citrate today, assuming good result, would continue with Senokot S1 to 2 tablets daily until bowel habits regular again Okay to fill clonazepam #20 one additional time until PCP followup Great to hear renal lesion was not cancer

## 2013-01-15 NOTE — Telephone Encounter (Signed)
Informed pt of Dr Lauro Franklin instructions and we ordered Klonopin again, but she should contact here PCP for further meds; pt stated understanding.

## 2013-02-14 ENCOUNTER — Telehealth: Payer: Self-pay | Admitting: *Deleted

## 2013-02-14 NOTE — Telephone Encounter (Signed)
Informed pt of Dr Lauro Franklin instructions for constipation. Pt stated understanding.

## 2013-02-14 NOTE — Telephone Encounter (Signed)
Phoned pt to schedule a f/u appt. Pt reports she has been meaning to call us because since her kidney surgery, she is back to her original problem of stomach pain and IBS symptoms even though she hasn't had diarrhea since before her surgery. Her problem now is constipation. She remains on 1-2 Percocet daily for back and stomach pain. She is taking Miralax, and metamucil; informed her she may use both 1-2 times a day. She tried an enema, but it burned her rectal area and I informed her she must have broken skin there; she is also using suppositories.  Pt has an appt on 02/27/13, do you want her to try some meds for constipation prior to her OV? Thanks.

## 2013-02-14 NOTE — Telephone Encounter (Signed)
Would titrate miralax to response.  17 g once to twice daily with 8 oz of water or liquid with each dose Avoid more than 1 dose of metamucil per day as fiber in excess can be bloating

## 2013-02-14 NOTE — Telephone Encounter (Signed)
Message copied by Florene Glen on Wed Feb 14, 2013  1:14 PM ------      Message from: Beverley Fiedler      Created: Wed Feb 14, 2013 11:48 AM       She needs OV for follow in sept or oct ------

## 2013-02-20 ENCOUNTER — Ambulatory Visit: Payer: BC Managed Care – PPO | Admitting: Internal Medicine

## 2013-02-27 ENCOUNTER — Ambulatory Visit (INDEPENDENT_AMBULATORY_CARE_PROVIDER_SITE_OTHER): Payer: BC Managed Care – PPO | Admitting: Internal Medicine

## 2013-02-27 ENCOUNTER — Encounter: Payer: Self-pay | Admitting: Internal Medicine

## 2013-02-27 ENCOUNTER — Other Ambulatory Visit: Payer: Self-pay | Admitting: Gastroenterology

## 2013-02-27 VITALS — BP 116/72 | HR 68 | Ht 66.5 in | Wt 162.0 lb

## 2013-02-27 DIAGNOSIS — R198 Other specified symptoms and signs involving the digestive system and abdomen: Secondary | ICD-10-CM

## 2013-02-27 DIAGNOSIS — D3000 Benign neoplasm of unspecified kidney: Secondary | ICD-10-CM

## 2013-02-27 DIAGNOSIS — R109 Unspecified abdominal pain: Secondary | ICD-10-CM

## 2013-02-27 DIAGNOSIS — K589 Irritable bowel syndrome without diarrhea: Secondary | ICD-10-CM

## 2013-02-27 DIAGNOSIS — F411 Generalized anxiety disorder: Secondary | ICD-10-CM

## 2013-02-27 DIAGNOSIS — D3001 Benign neoplasm of right kidney: Secondary | ICD-10-CM

## 2013-02-27 DIAGNOSIS — Z8619 Personal history of other infectious and parasitic diseases: Secondary | ICD-10-CM

## 2013-02-27 DIAGNOSIS — F419 Anxiety disorder, unspecified: Secondary | ICD-10-CM

## 2013-02-27 HISTORY — DX: Benign neoplasm of right kidney: D30.01

## 2013-02-27 MED ORDER — PANCRELIPASE (LIP-PROT-AMYL) 36000-114000 UNITS PO CPEP: 1.0000 | ORAL_CAPSULE | Freq: Four times a day (QID) | ORAL | Status: DC | PRN

## 2013-02-27 MED ORDER — DICYCLOMINE HCL 20 MG PO TABS: 20.0000 mg | ORAL_TABLET | Freq: Four times a day (QID) | ORAL | Status: DC

## 2013-02-27 MED ORDER — LANSOPRAZOLE 30 MG PO CPDR: 30.0000 mg | DELAYED_RELEASE_CAPSULE | Freq: Every day | ORAL | Status: DC

## 2013-02-27 NOTE — Progress Notes (Signed)
Subjective:    Patient ID: Traci Johnston, female    DOB: Aug 11, 1960, 52 y.o.   MRN: [id number]  HPI Traci Johnston is a 52 yo female with PMH of H. pylori gastritis, IBS, gallstones status post cholecystectomy, kidney stones, and history of renal oncocytoma status post resection who is seen in followup.  After her last visit an upper endoscopy and colonoscopy were recommended and performed on 12/08/2012. The upper endoscopy revealed a normal esophagus, gastritis which was biopsied, a small hiatal hernia, and a normal examined duodenum. Biopsies from the stomach were positive for H. pylori gastritis. She was treated with Pylera and completed therapy. Colonoscopy was normal to the terminal ileum except for one small distal colon polyp. Biopsies were negative for microscopic colitis. The polyp was hyperplastic. Complete abdominal ultrasound was recommended after her last visit, which was performed and showed CBD dilation and an incidental renal lesion. Subsequent MRI of the abdomen revealed stable biliary dilatation status post cholecystectomy. There was no evidence of choledocholithiasis, pancreatic or ampullary mass. The right kidney lesion was again seen and felt to be concerning for possible RCC.  She was subsequently referred to urology, and later had surgery with removal of this lesion and it was found to be an oncocytoma.  Today she returns with her husband. She continues to have alternating diarrhea and constipation along with abdominal bloating and at times abdominal pain. She reports this is almost always postprandially. She reports frequently greasy or fatty stools. She tries to eat a very healthy, low fat diet naturally. She is taking Bentyl 20 mg 4 times daily and Prevacid 30 mg twice daily. Weight has been stable. She denies nausea or vomiting. No heartburn. No trouble swallowing. She reports at times she feels very constipated, and then at other times she has "attacks" of loose and urgent stools. Her  stools are nonbloody and non-melenic.  She did try MiraLax and also Metamucil but found no medication to be overly helpful. She also admits to issues with anxiety and also depressed mood. No SI or HI.  Review of Systems As per history of present illness, otherwise negative  Current Medications, Allergies, Past Medical History, Past Surgical History, Family History and Social History were reviewed in [employer] record.     Objective:   Physical Exam BP 116/72  Pulse 68  Ht 5' 6.5" (1.689 m)  Wt 162 lb (73.483 kg)  BMI 25.76 kg/m2 Constitutional: Well-developed and well-nourished. No distress. HEENT: Normocephalic and atraumatic. Oropharynx is clear and moist. No oropharyngeal exudate. Conjunctivae are normal.  No scleral icterus. Neck: Neck supple. Trachea midline. Cardiovascular: Normal rate, regular rhythm and intact distal pulses. No M/R/G Pulmonary/chest: Effort normal and breath sounds normal. No wheezing, rales or rhonchi. Abdominal: Soft, mild mid abdominal tenderness without rebound or guarding, nondistended. Bowel sounds active throughout. There are no masses palpable. No hepatosplenomegaly. Well-healed laparoscopic scars Extremities: no clubbing, cyanosis, or edema Lymphadenopathy: No cervical adenopathy noted. Neurological: Alert and oriented to person place and time. Skin: Skin is warm and dry. No rashes noted. Psychiatric: Normal mood and affect. Behavior is normal.  CMP     Component Value Date/Time   NA 136 01/11/2013 0900   K 4.0 01/11/2013 0900   CL 100 01/11/2013 0900   CO2 27 01/11/2013 0900   GLUCOSE 100* 01/11/2013 0900   BUN 7 01/11/2013 0900   CREATININE 0.95 01/11/2013 0900   CALCIUM 9.0 01/11/2013 0900   PROT 6.2 01/11/2013 0900   ALBUMIN 2.8* 01/11/2013  0900   AST 22 01/11/2013 0900   ALT 24 01/11/2013 0900   ALKPHOS 73 01/11/2013 0900   BILITOT 0.3 01/11/2013 0900   GFRNONAA 69* 01/11/2013 0900   GFRAA 80* 01/11/2013 0900    CBC     Component Value Date/Time   WBC 10.5 01/11/2013 0900   RBC 3.75* 01/11/2013 0900   HGB 10.4* 01/11/2013 0900   HCT 32.6* 01/11/2013 0900   PLT 231 01/11/2013 0900   MCV 86.9 01/11/2013 0900   MCH 27.7 01/11/2013 0900   MCHC 31.9 01/11/2013 0900   RDW 14.0 01/11/2013 0900   LYMPHSABS 2.5 01/11/2013 0900   MONOABS 0.7 01/11/2013 0900   EOSABS 0.0 01/11/2013 0900   BASOSABS 0.0 01/11/2013 0900      MRI ABDOMEN WITHOUT AND WITH CONTRAST (INCLUDING MRCP)   Technique:  Multiplanar multisequence MR imaging of the abdomen was performed both before and after the administration of intravenous contrast. Heavily T2-weighted images of the biliary and pancreatic ducts were obtained, and three-dimensional MRCP images were rendered by post processing.   Contrast: 15mL MULTIHANCE GADOBENATE DIMEGLUMINE 529 MG/ML IV SOLN   Comparison:  Abdominal ultrasound 12/07/2012.   Findings:  Redemonstrated is moderate extrahepatic biliary dilatation.  The common hepatic duct measures up to 12 mm in diameter.  The common bile duct tapers distally.  There is no evidence of choledocholithiasis.  Mild intrahepatic biliary dilatation is present.   There is no evidence of pancreatic ductal dilatation or pancreas divisum.  There is no evidence of pancreatic mass, surrounding inflammatory change or fluid collection.   There are scattered T2 hyperintense liver lesions, the largest inferiorly in the right hepatic lobe measuring 5 mm in diameter. These lesions do not show any definite enhancement and are probably incidental cysts.  There are no enhancing liver lesions.  The spleen and adrenal glands appear normal.   Corresponding with the indeterminate lesion in the lower pole of the right kidney is a mildly complex enhancing lesion.  This measures approximate 1.9 x 1.7 x 1.8 cm and demonstrates heterogeneous T2 signal and heterogeneous enhancement following contrast.  No other enhancing renal masses are  demonstrated.  The left kidney appears normal.  There is no evidence of retroperitoneal lymphadenopathy or renal vein abnormality.   There is a mild convex left thoracolumbar scoliosis.  No worrisome osseous lesions are seen.   IMPRESSION:   1.  Stable biliary dilatation status post cholecystectomy.  This is of undetermined etiology (without evidence of choledocholithiasis, pancreatic or ampullary mass).  This may be physiologic in this patient who had recent normal LFTs. 2.  The mass involving the lower pole of the right kidney on ultrasound is enhancing and consistent with renal cell carcinoma. No evidence of metastatic disease. Urology consultation recommended. 3.  Small indeterminate liver lesions, likely cysts.    COMPLETE ABDOMINAL ULTRASOUND   Comparison:  None.   Findings:   Gallbladder:  Not visualized.   Common bile duct:  Measures 16 mm.   Liver:  No focal lesion identified.  Within normal limits in parenchymal echogenicity.   IVC:  Appears normal.   Pancreas:  No focal abnormality seen.   Spleen:  Measures 7.8 cm.   Right Kidney:  Measures 9.6 cm.  Possible 1.8 x 1.3 x 1.8 cm isoechoic solid lesion in the lower pole.  No hydronephrosis.   Left Kidney:  Measures 10.8 cm.  No mass or hydronephrosis.   Abdominal aorta:  No aneurysm identified.   IMPRESSION: Gallbladder is not  visualized.   Dilated common duct, measuring 16 mm.  Correlate with LFTs.   Possible 1.8 cm isoechoic solid lesion in the lower pole of the right kidney.   Consider MRI abdomen with/without contrast (including MRCP) for further evaluation of the common duct and right kidney.       Assessment & Plan:  52 yo female with PMH of H. pylori gastritis, IBS, gallstones status post cholecystectomy, kidney stones, and history of renal oncocytoma status post resection who is seen in followup.   1.  Abd pain/bloating/alternating bowel habits/Hx of H Pylori -- I have recommended  confirmation of H. pylori eradication with stool antigen. This will be ordered. Her symptoms could be secondary to mild pancreatic insufficiency as she describes stearrhea (oil droplets mixed with stool).  She has had cross-sectional imaging which did not reveal overt pancreatic pathology or mass lesion. I will try her on Creon one tablet with snacks and 2 tablets with meals. She is advised not to take this if she doesn't eat. Also would like for her to decrease her Bentyl from scheduled administration to as needed only for cramping. I also feel it appropriate to reduce her Prevacid from 30 mg twice daily, to once daily dosing. She is advised to take this 30 minutes to one hour before her first meal of the day. I would like to see her back in 6 weeks and reassess her symptoms after the trial of pancreatic enzymes. Also, some component of her symptoms could be IBS related  2.  Depression/anxiety -- we spoke about her anxiety and depression today, and both she and her husband feel like this is becoming more of an issue which is impacting her daily life. She is open to meeting with the psychologist and I will refer her to Dr. Dellia Cloud for evaluation.  3.  Renal oncocytoma -- being followed by Dr. Laverle Patter with urology.  I, as she, was very pleased to hear this was not renal cell carcinoma   Return in 6 weeks

## 2013-02-27 NOTE — Patient Instructions (Addendum)
We have sent the following medications to your pharmacy for you to pick up at your convenience: Start taking bentyl as needed for abdominal cramping; Creon; 1 tablet with snacks and 2 tablets with meals Take nexium daily  Follow up in 6 weeks with Dr. Rhea Belton in office  You have been referred to Psychology Dr. Dellia Cloud; they will contact you about your appointment                                               We are excited to introduce MyChart, a new best-in-class service that provides you online access to important information in your electronic medical record. We want to make it easier for you to view your health information - all in one secure location - when and where you need it. We expect MyChart will enhance the quality of care and service we provide.  When you register for MyChart, you can:    View your test results.    Request appointments and receive appointment reminders via email.    Request medication renewals.    View your medical history, allergies, medications and immunizations.    Communicate with your physician's office through a password-protected site.    Conveniently print information such as your medication lists.  To find out if MyChart is right for you, please talk to a member of our clinical staff today. We will gladly answer your questions about this free health and wellness tool.  If you are age 51 or older and want a member of your family to have access to your record, you must provide written consent by completing a proxy form available at our office. Please speak to our clinical staff about guidelines regarding accounts for patients younger than age 46.  As you activate your MyChart account and need any technical assistance, please call the MyChart technical support line at [phone] [phone]) or email your question to [email or web address]. If you email your question(s), please include your name, a return phone number and the best time to  reach you.  If you have non-urgent health-related questions, you can send a message to our office through MyChart at [email or web address]. If you have a medical emergency, call 911.  Thank you for using MyChart as your new health and wellness resource!   MyChart licensed from [employer],  4540-9811. Patents Pending.   Marland Kitchen

## 2013-02-28 ENCOUNTER — Other Ambulatory Visit: Payer: Self-pay | Admitting: Gastroenterology

## 2013-02-28 ENCOUNTER — Telehealth: Payer: Self-pay | Admitting: Gastroenterology

## 2013-02-28 DIAGNOSIS — Z8619 Personal history of other infectious and parasitic diseases: Secondary | ICD-10-CM

## 2013-02-28 NOTE — Telephone Encounter (Signed)
lvm for pt to call me back regarding Lab Dr. Rhea Belton would like her to have

## 2013-02-28 NOTE — Telephone Encounter (Signed)
Spoke to pt told her dr. Rhea Belton would like her to stop in our lab to be to do an H. Pylori stool antigen to confirm eraadication. Told her to hold PPI; she said she has not had her nexium filled yet and will wait until after she has the lab work done.

## 2013-02-28 NOTE — Telephone Encounter (Signed)
Message copied by Richardo Hanks on Wed Feb 28, 2013  1:24 PM ------      Message from: Beverley Fiedler      Created: Tue Feb 27, 2013  5:30 PM       She needs H. pylori stool antigen to confirm eradication of recently treated H. pylori. PPI needs to be stopped as appropriate before this test ------

## 2013-03-01 ENCOUNTER — Telehealth: Payer: Self-pay | Admitting: Gastroenterology

## 2013-03-01 NOTE — Telephone Encounter (Signed)
Spoke to to pt who called in worried about a Rx that is going to be almost $1000. She said it was for Nexium, but she was not prescribed Nexium she was prescribed Prevacid, pt said it wasn't that med because she picked that one up and it was only $10. She has yet to pick up the Creon, I said that may be the medication they gave you the pricing on and if that's the case use the co-pay card you were given for it at your office visit, and if it's still going to be too expensive to call me back so we can find a way to help. Pt verbalized understanding.

## 2013-03-15 ENCOUNTER — Telehealth: Payer: Self-pay | Admitting: Internal Medicine

## 2013-03-15 DIAGNOSIS — K589 Irritable bowel syndrome without diarrhea: Secondary | ICD-10-CM

## 2013-03-15 DIAGNOSIS — F419 Anxiety disorder, unspecified: Secondary | ICD-10-CM

## 2013-03-15 DIAGNOSIS — R109 Unspecified abdominal pain: Secondary | ICD-10-CM

## 2013-03-15 NOTE — Telephone Encounter (Signed)
Pt reports she has not had the H.Pylori test done yet because she just found out her mom has breast cancer; she has been very caught up in her care, but she will have it done. I asked if creon was helping and she states she thinks it is. She still only eats 1 meal a day, but the pain isn't as bad. She hopes when she gets her mom's situation under control, treatment options, surgery, etc, she will be able to pay more attention to herself.  She states she doesn't have time now to go, but she never heard from the psychologist. Kennyth Arnold, have you heard from Dr [facility] on referral? Thanks.

## 2013-03-16 MED ORDER — PANCRELIPASE (LIP-PROT-AMYL) 36000-114000 UNITS PO CPEP: 1.0000 | ORAL_CAPSULE | Freq: Four times a day (QID) | ORAL | Status: DC | PRN

## 2013-03-16 NOTE — Telephone Encounter (Signed)
Informed pt of ABBVIE assistance and that I left her samples of Creon. I am still checking on her referral to Dr Dellia Cloud

## 2013-03-16 NOTE — Telephone Encounter (Signed)
Received a call that Creon would cost pt several hundred dollars. I have left a message for pt to call back. I will give her samples of Creon and ask her to call the ABBVIE assistance program and I left her a starter kit with a discount card. lmom at Dr [facility]way[facility] Cirri [facility] about the referral made on 02/27/13.

## 2013-03-16 NOTE — Telephone Encounter (Signed)
Received a call from Dr Dawayne Cirri of[facility] and he is no longer accepting pts. Darl Pikes ?, will make a referral and have someone call me Monday on how to put the referral.

## 2013-03-19 NOTE — Telephone Encounter (Addendum)
Terry at [facility] called to report they have the referral and will call the pt about a referral for her anxiety.

## 2013-03-21 ENCOUNTER — Telehealth: Payer: Self-pay | Admitting: *Deleted

## 2013-03-21 ENCOUNTER — Encounter: Payer: Self-pay | Admitting: Internal Medicine

## 2013-03-21 ENCOUNTER — Other Ambulatory Visit: Payer: Self-pay | Admitting: Gastroenterology

## 2013-03-21 ENCOUNTER — Telehealth: Payer: Self-pay | Admitting: Internal Medicine

## 2013-03-21 DIAGNOSIS — R109 Unspecified abdominal pain: Secondary | ICD-10-CM

## 2013-03-21 DIAGNOSIS — K589 Irritable bowel syndrome without diarrhea: Secondary | ICD-10-CM

## 2013-03-21 DIAGNOSIS — F419 Anxiety disorder, unspecified: Secondary | ICD-10-CM

## 2013-03-21 MED ORDER — LANSOPRAZOLE 30 MG PO CPDR: 30.0000 mg | DELAYED_RELEASE_CAPSULE | Freq: Every day | ORAL | Status: DC

## 2013-03-21 NOTE — Telephone Encounter (Signed)
Pt call sent in error - Dr. Lauro Franklin patient

## 2013-03-21 NOTE — Telephone Encounter (Signed)
Pt reports she's leaving for [facility] and needs a refill on Lansoprazole; ordered and we discussed the fact she is to reduce to once daily. Pt stated understanding.

## 2013-03-21 NOTE — Telephone Encounter (Signed)
Error

## 2013-03-27 ENCOUNTER — Ambulatory Visit: Payer: BC Managed Care – PPO | Admitting: Licensed Clinical Social Worker

## 2013-11-01 ENCOUNTER — Encounter: Payer: Self-pay | Admitting: Internal Medicine

## 2013-11-05 ENCOUNTER — Encounter: Payer: Self-pay | Admitting: Internal Medicine

## 2013-11-05 ENCOUNTER — Ambulatory Visit (INDEPENDENT_AMBULATORY_CARE_PROVIDER_SITE_OTHER): Payer: BC Managed Care – PPO | Admitting: Internal Medicine

## 2013-11-05 VITALS — BP 112/78 | HR 64 | Ht 65.5 in | Wt 161.1 lb

## 2013-11-05 DIAGNOSIS — F329 Major depressive disorder, single episode, unspecified: Secondary | ICD-10-CM

## 2013-11-05 DIAGNOSIS — K5909 Other constipation: Secondary | ICD-10-CM

## 2013-11-05 DIAGNOSIS — R110 Nausea: Secondary | ICD-10-CM

## 2013-11-05 DIAGNOSIS — K5900 Constipation, unspecified: Secondary | ICD-10-CM

## 2013-11-05 DIAGNOSIS — K838 Other specified diseases of biliary tract: Secondary | ICD-10-CM

## 2013-11-05 DIAGNOSIS — F4321 Adjustment disorder with depressed mood: Secondary | ICD-10-CM

## 2013-11-05 DIAGNOSIS — F3289 Other specified depressive episodes: Secondary | ICD-10-CM

## 2013-11-05 DIAGNOSIS — F32A Depression, unspecified: Secondary | ICD-10-CM

## 2013-11-05 DIAGNOSIS — K589 Irritable bowel syndrome without diarrhea: Secondary | ICD-10-CM

## 2013-11-05 MED ORDER — ONDANSETRON 4 MG PO TBDP: 4.0000 mg | ORAL_TABLET | Freq: Three times a day (TID) | ORAL | Status: DC | PRN

## 2013-11-05 NOTE — Progress Notes (Addendum)
Subjective:    Patient ID: Traci Johnston, female    DOB: 03-30-1961, 53 y.o.   MRN: [id number]  HPI Traci Johnston is a 53 yo female with PMH of H. pylori gastritis treated with confirmed eradication, IBS, gallstones status post cholecystectomy, kidney stones, and history of renal oncocytoma status post resection who is seen in followup. She is here today with her husband. She was last seen in September 2014. At the time of her last office visit with me she was having issues with intermittent abdominal pain, bloating and alternating bowel habits. She was describing symptoms which could be consistent with the area and she was given a trial of Creon. She was asked to return in 6 weeks, but she did not return for followup. She did undergo EGD and colonoscopy on 12/08/2012. The upper endoscopy revealed gastritis and a small hiatal hernia. She was positive for H. pylori and completed treatment. Colonoscopy was normal to the terminal LM except for a hyperplastic rectal polyp. There was no evidence of microscopic colitis on random biopsies. Ultrasound was performed within the last year which showed mild CBD dilation and incidental renal lesion. Subsequent MRI/MRCP revealed stable biliary dilatation without evidence of choledocholithiasis, pancreatic or ampullary mass.  She reports also with the help of her husband, that her mother died suddenly in 05-14-13. She found her mother at home and this was quite traumatic for her. Since this time she reports significant issues with depression and grief. She has not spoken to a psychologist or psychiatrist about this. Her primary care provider did start Prozac earlier this month.  She was hospitalized several weeks ago in [place] for abdominal pain and nausea. Reportedly cross-sectional imaging was repeated as was EGD and colonoscopy. They report her subsequent endoscopies were normal. She was hospitalized for about 5 days. She recalls hearing her  intestines were "inflamed and swollen". She has also been very recently seen by Dr. Rolm Bookbinder, a gastroenterologist at [facility].  Reportedly he is another test scheduled for her this week.  She reports on and off issues with abdominal discomfort. This can be in the epigastrium but also lower abdomen. She still with constipation which improves when she uses MiraLax regularly. She has frequent nausea but no vomiting. Appetite has been decreased. Again she expresses depressed mood without SI/HI.  She denies blood in her stool or melena. No trouble swallowing. She has taken pantoprazole 40 mg daily and with this denies heartburn.  Review of Systems As per history of present illness, otherwise negative  Current Medications, Allergies, Past Medical History, Past Surgical History, Family History and Social History were reviewed in [employer] record.     Objective:   Physical Exam BP 112/78  Pulse 64  Ht 5' 5.5" (1.664 m)  Wt 161 lb 2 oz (73.086 kg)  BMI 26.40 kg/m2 Constitutional: Well-developed and well-nourished. No distress. HEENT: Normocephalic and atraumatic. Oropharynx is clear and moist. No oropharyngeal exudate. Conjunctivae are normal.  No scleral icterus. Neck: Neck supple. Trachea midline. Cardiovascular: Normal rate, regular rhythm and intact distal pulses. No M/R/G Pulmonary/chest: Effort normal and breath sounds normal. No wheezing, rales or rhonchi. Abdominal: Soft, mild diffuse tenderness without rebound or guarding, nondistended. Bowel sounds active throughout.  Extremities: no clubbing, cyanosis, or edema Lymphadenopathy: No cervical adenopathy noted. Neurological: Alert and oriented to person place and time. Skin: Skin is warm and dry. No rashes noted. Psychiatric: Normal mood and affect. Behavior is normal.  Records requested from recent  hospitalization, GI procedures, and GI office visit.    Assessment & Plan:  53 yo female with PMH of H.  pylori gastritis treated with confirmed eradication, IBS, gallstones status post cholecystectomy, kidney stones, and history of renal oncocytoma status post resection who is seen in followup.  1.  Abd pain/IBS/constipation -- I do think a large portion of her abdominal symptomatology is secondary to irritable bowel syndrome likely exacerbated by depression and grief. I strongly encouraged her to see psychology and I am referring her to Dr. Apolonio Schneiders in our group. I think she would benefit greatly from counseling and she is agreeable to this and this is fully supported by her husband. She seemed to benefit from Creon which was likely beneficial to irritable bowel and I have given her samples to continue with this 1-2 tablets with meals. I would also like her to continue MiraLax to avoid constipation. She is denying stearrhea recently, and she certainly has no evidence of pancreatic insufficiency or pancreatic lesions with recent imaging. I encouraged her to use Zofran 4 mg every 6-8 hours as needed for nausea.  2.  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 Cross-sectional imaging within the past year did not show evidence for CBD stone, stricture or pancreatic lesion which might cause compression.  Her liver enzymes when checked twice last summer were normal. I'm assuming a recheck during hospitalization and likely normal.  We have requested records  3.  Depression/grief/anxiety -- psychology referral as discussed in #1   Return in 6-8 weeks  Addendum: I did contact her gastroenterologist, Dr. Rolm Bookbinder. I spoke with his nurse. He had recently seen her in clinic and etiology 2 abdominal pain was not clear. He has ordered mesenteric Doppler, gastric emptying study, and referred her to [facility] for EUS to evaluate biliary ductal dilatation. --I discussed this with the patient by phone. She will likely proceed to consultation at [facility] for possible EUS. She will  complete this workup and return to see me. It is likely that her CBD dilatation is secondary to post cholecystectomy state but EUS will be definitive in this regard. She thanked me for the call and time was provided for questions and answers

## 2013-11-05 NOTE — Patient Instructions (Signed)
We have sent the following medications to your pharmacy for you to pick up at your convenience: Zofran 4 mg every 6-8 as needed for nausea. Continue taking Protonix, and miralax. You have been give samples of Creon 36,000, please call our office to let us know if this is working for you and we will send in an prescription.  You have been referred to Dr. Rocco Serene office. They will contact you to set up that appointment.

## 2013-11-06 ENCOUNTER — Telehealth: Payer: Self-pay | Admitting: Gastroenterology

## 2013-11-06 NOTE — Telephone Encounter (Signed)
Faxed referral and office notes to Dr. Venancio Poisson office

## 2013-11-16 ENCOUNTER — Telehealth: Payer: Self-pay | Admitting: Internal Medicine

## 2013-11-16 NOTE — Telephone Encounter (Signed)
Patient was asking about the results of EUS she had yesterday at [facility].  She has questions for the MD.  She is given the number to contact [facility].

## 2013-11-30 ENCOUNTER — Telehealth: Payer: Self-pay | Admitting: Internal Medicine

## 2013-11-30 MED ORDER — PANCRELIPASE (LIP-PROT-AMYL) 36000-114000 UNITS PO CPEP: 1.0000 | ORAL_CAPSULE | Freq: Three times a day (TID) | ORAL | Status: DC

## 2013-11-30 NOTE — Telephone Encounter (Signed)
Sent rx for creon to pt's pharmacy

## 2013-12-26 ENCOUNTER — Encounter: Payer: Self-pay | Admitting: Internal Medicine

## 2013-12-31 ENCOUNTER — Ambulatory Visit (INDEPENDENT_AMBULATORY_CARE_PROVIDER_SITE_OTHER): Payer: BC Managed Care – PPO | Admitting: Internal Medicine

## 2013-12-31 ENCOUNTER — Other Ambulatory Visit (INDEPENDENT_AMBULATORY_CARE_PROVIDER_SITE_OTHER): Payer: BC Managed Care – PPO

## 2013-12-31 ENCOUNTER — Encounter: Payer: Self-pay | Admitting: Internal Medicine

## 2013-12-31 VITALS — BP 100/60 | HR 70 | Ht 65.5 in | Wt 157.0 lb

## 2013-12-31 DIAGNOSIS — F32A Depression, unspecified: Secondary | ICD-10-CM

## 2013-12-31 DIAGNOSIS — F4310 Post-traumatic stress disorder, unspecified: Secondary | ICD-10-CM

## 2013-12-31 DIAGNOSIS — F341 Dysthymic disorder: Secondary | ICD-10-CM

## 2013-12-31 DIAGNOSIS — R109 Unspecified abdominal pain: Secondary | ICD-10-CM

## 2013-12-31 DIAGNOSIS — F419 Anxiety disorder, unspecified: Secondary | ICD-10-CM

## 2013-12-31 DIAGNOSIS — K5900 Constipation, unspecified: Secondary | ICD-10-CM

## 2013-12-31 DIAGNOSIS — G8929 Other chronic pain: Secondary | ICD-10-CM

## 2013-12-31 DIAGNOSIS — F329 Major depressive disorder, single episode, unspecified: Secondary | ICD-10-CM

## 2013-12-31 DIAGNOSIS — K589 Irritable bowel syndrome without diarrhea: Secondary | ICD-10-CM

## 2013-12-31 LAB — COMPREHENSIVE METABOLIC PANEL
ALT: 18 U/L (ref 0–35)
AST: 20 U/L (ref 0–37)
Albumin: 3.8 g/dL (ref 3.5–5.2)
Alkaline Phosphatase: 64 U/L (ref 39–117)
BUN: 17 mg/dL (ref 6–23)
CALCIUM: 9 mg/dL (ref 8.4–10.5)
CHLORIDE: 107 meq/L (ref 96–112)
CO2: 27 mEq/L (ref 19–32)
CREATININE: 0.9 mg/dL (ref 0.4–1.2)
GFR: 88.04 mL/min (ref >60.00)
Glucose, Bld: 89 mg/dL (ref 70–99)
Potassium: 4.3 mEq/L (ref 3.5–5.1)
Sodium: 139 mEq/L (ref 135–145)
Total Bilirubin: 0.4 mg/dL (ref 0.2–1.2)
Total Protein: 6.8 g/dL (ref 6.0–8.3)

## 2013-12-31 LAB — CBC WITH DIFFERENTIAL/PLATELET
BASOS PCT: 2.9 % (ref 0.0–3.0)
Basophils Absolute: 0.1 10*3/uL (ref 0.0–0.1)
EOS ABS: 0 10*3/uL (ref 0.0–0.7)
Eosinophils Relative: 0.9 % (ref 0.0–5.0)
HCT: 37.5 % (ref 36.0–46.0)
HEMOGLOBIN: 12.1 g/dL (ref 12.0–15.0)
LYMPHS PCT: 29.5 % (ref 12.0–46.0)
Lymphs Abs: 1.3 10*3/uL (ref 0.7–4.0)
MCHC: 32.3 g/dL (ref 30.0–36.0)
MCV: 88.1 fl (ref 78.0–100.0)
MONOS PCT: 7.2 % (ref 3.0–12.0)
Monocytes Absolute: 0.3 10*3/uL (ref 0.1–1.0)
NEUTROS ABS: 2.7 10*3/uL (ref 1.4–7.7)
NEUTROS PCT: 59.5 % (ref 43.0–77.0)
Platelets: 218 10*3/uL (ref 150.0–400.0)
RBC: 4.26 Mil/uL (ref 3.87–5.11)
RDW: 14.3 % (ref 11.5–15.5)
WBC: 4.5 10*3/uL (ref 4.0–10.5)

## 2013-12-31 MED ORDER — ALIGN PO CAPS: 1.0000 | ORAL_CAPSULE | Freq: Every day | ORAL | Status: AC

## 2013-12-31 NOTE — Progress Notes (Signed)
Subjective:    Patient ID: Traci Johnston, female    DOB: 06-Oct-1960, 53 y.o.   MRN: [id number]  HPI Traci Johnston is a 53 year old female with a past medical history of H. pylori gastritis with confirmed eradication, IBS, gallstones status post cholecystectomy, kidney stones, renal oncocytoma status post resection, anxiety and depression who is seen for followup. She is here today with her husband and daughter. She was last seen in May 2015. See past note for further details.  Today she reports she is feeling better from a GI perspective. She was started on Creon and she reports this helped significantly with abdominal pain. She is no longer having abdominal pain. She does complain of foul-smelling gas but denies bloating. No nausea or vomiting. She is using less oxycodone which she was previously taking for bursitis type pain. She is taking Protonix 40 mg daily, MiraLax 17 g 3 times weekly and Creon 2 tablets before meals. She has continued to struggle with anxiety, depression, flashbacks from finding her mother deceased in 2023/05/13. She is having trouble sleeping and frequent crying. I recommended that she be seen by psychology after her last visit but this appointment was never scheduled. Overall from a GI perspective she feels better today but appetite has been poor, which she relates to her stress. Her husband feels like this is also her biggest issue. Lost 4 pounds since last seeing me but overall reports she has gone from a size 12 to a size 8 and her clothes.  No fevers or chills. No melena or rectal bleeding. No significant nausea or vomiting.   Review of Systems As per history of present illness, otherwise negative  Current Medications, Allergies, Past Medical History, Past Surgical History, Family History and Social History were reviewed in [employer] record.     Objective:   Physical Exam BP 100/60  Pulse 70  Ht 5' 5.5" (1.664 m)  Wt 157 lb (71.215 kg)  BMI  25.72 kg/m2 Constitutional: Well-developed and well-nourished. No distress. HEENT: Normocephalic and atraumatic. Oropharynx is clear and moist. No oropharyngeal exudate. Conjunctivae are normal.  No scleral icterus. Neck: Neck supple. Trachea midline. Cardiovascular: Normal rate, regular rhythm and intact distal pulses. No M/R/G Pulmonary/chest: Effort normal and breath sounds normal. No wheezing, rales or rhonchi. Abdominal: Soft, mild diffuse tenderness without rebound or guarding, nondistended. Bowel sounds active throughout. There are no masses palpable. Extremities: no clubbing, cyanosis, or edema Lymphadenopathy: No cervical adenopathy noted. Neurological: Alert and oriented to person place and time. Skin: Skin is warm and dry. No rashes noted. Psychiatric: Normal mood and affect. Behavior is normal.    Assessment & Plan:   53 year old female with a past medical history of H. pylori gastritis with confirmed eradication, IBS, gallstones status post cholecystectomy, kidney stones, renal oncocytoma status post resection, anxiety and depression who is seen for followup.  1. IBS-C/abd pain -- I do feel that she has irritable bowel with constipation predominance. This is certainly exacerbated by her anxiety, depression, and probable PTSD. We have improved a lot if not all of her GI complaint with daily PPI, pancreatic enzymes, and MiraLax. We discussed the brain-gut axis and how certainly IBS can worsen with stress, anxiety, and depression.  I want her to try to be seen by psychology as soon as possible. --For now we will continue MiraLax 17 g 3 days per week, pantoprazole 40 mg daily, and Creon 1 tablet before snacks and 2 tablets before meals. --I have recommended Align 1  capsule daily to help her with "foul-smelling" lower gas.  2.  CBD dilatation -- felt to be secondary to cholecystectomy, cross-sectional imaging within the past year did not show any evidence for CBD stone, stricture or  pancreatic lesion. Repeat CBC and liver enzymes today  3.  Depression/anxiety/possible PTSD -- this is her largest medical concern at present, in my opinion. I will refer her again to Dr. Cheryln Manly and have asked that she contact his office for an appointment. She is in full agreement as is her husband and daughter.  Return in 3 months, sooner if necessary

## 2013-12-31 NOTE — Patient Instructions (Addendum)
Please contact Dr Venancio Poisson office to schedule your appointment at [phone] Dr Hilarie Fredrickson will email Dr Cheryln Manly Continue current regimen of Miralax,creon ,Protonix and carafate Follow up in 3 months (Please call back to schedule that appointment) Take Align one a day for 1 month (sample box given)

## 2014-01-02 ENCOUNTER — Telehealth: Payer: Self-pay | Admitting: Internal Medicine

## 2014-01-02 NOTE — Telephone Encounter (Signed)
Rec'd from [facility] PA forward 14 pages to Dr. Hilarie Fredrickson

## 2014-01-09 ENCOUNTER — Other Ambulatory Visit: Payer: BC Managed Care – PPO

## 2014-01-09 DIAGNOSIS — Z8619 Personal history of other infectious and parasitic diseases: Secondary | ICD-10-CM

## 2014-01-11 LAB — HELICOBACTER PYLORI  SPECIAL ANTIGEN: H. PYLORI Antigen: POSITIVE

## 2014-01-16 ENCOUNTER — Other Ambulatory Visit: Payer: Self-pay

## 2014-01-16 MED ORDER — BIS SUBCIT-METRONID-TETRACYC 140-125-125 MG PO CAPS: 3.0000 | ORAL_CAPSULE | Freq: Three times a day (TID) | ORAL | Status: DC

## 2014-01-17 ENCOUNTER — Ambulatory Visit (INDEPENDENT_AMBULATORY_CARE_PROVIDER_SITE_OTHER): Payer: BC Managed Care – PPO | Admitting: Licensed Clinical Social Worker

## 2014-01-17 DIAGNOSIS — F331 Major depressive disorder, recurrent, moderate: Secondary | ICD-10-CM

## 2014-02-05 ENCOUNTER — Ambulatory Visit (INDEPENDENT_AMBULATORY_CARE_PROVIDER_SITE_OTHER): Payer: BC Managed Care – PPO | Admitting: Licensed Clinical Social Worker

## 2014-02-05 DIAGNOSIS — F331 Major depressive disorder, recurrent, moderate: Secondary | ICD-10-CM

## 2014-02-14 ENCOUNTER — Ambulatory Visit: Payer: BC Managed Care – PPO | Admitting: Licensed Clinical Social Worker

## 2014-03-06 ENCOUNTER — Telehealth: Payer: Self-pay | Admitting: Internal Medicine

## 2014-03-06 NOTE — Telephone Encounter (Signed)
I have left message for the patient to call back 

## 2014-03-07 ENCOUNTER — Ambulatory Visit (INDEPENDENT_AMBULATORY_CARE_PROVIDER_SITE_OTHER): Payer: BC Managed Care – PPO | Admitting: Licensed Clinical Social Worker

## 2014-03-07 DIAGNOSIS — F331 Major depressive disorder, recurrent, moderate: Secondary | ICD-10-CM

## 2014-03-26 ENCOUNTER — Ambulatory Visit: Payer: BC Managed Care – PPO | Admitting: Licensed Clinical Social Worker

## 2014-04-09 ENCOUNTER — Ambulatory Visit (INDEPENDENT_AMBULATORY_CARE_PROVIDER_SITE_OTHER): Payer: BC Managed Care – PPO | Admitting: Licensed Clinical Social Worker

## 2014-04-09 DIAGNOSIS — F332 Major depressive disorder, recurrent severe without psychotic features: Secondary | ICD-10-CM

## 2014-05-09 ENCOUNTER — Ambulatory Visit (INDEPENDENT_AMBULATORY_CARE_PROVIDER_SITE_OTHER): Payer: BC Managed Care – PPO | Admitting: Licensed Clinical Social Worker

## 2014-05-09 DIAGNOSIS — F332 Major depressive disorder, recurrent severe without psychotic features: Secondary | ICD-10-CM

## 2014-05-21 ENCOUNTER — Ambulatory Visit: Payer: BC Managed Care – PPO | Admitting: Licensed Clinical Social Worker

## 2014-05-28 ENCOUNTER — Ambulatory Visit: Payer: BC Managed Care – PPO | Admitting: Licensed Clinical Social Worker

## 2014-07-09 ENCOUNTER — Ambulatory Visit: Payer: Self-pay | Admitting: Licensed Clinical Social Worker

## 2014-07-18 ENCOUNTER — Ambulatory Visit: Payer: Self-pay | Admitting: Licensed Clinical Social Worker

## 2014-08-02 ENCOUNTER — Encounter: Payer: Self-pay | Admitting: *Deleted

## 2014-08-06 ENCOUNTER — Telehealth: Payer: Self-pay | Admitting: Internal Medicine

## 2014-08-06 NOTE — Telephone Encounter (Signed)
Advised patient that I submitted prior authorization via [email or web address] on 08/02/14. I contact [employer] today and spoke with Saniyyah. She states that they do have PA and it is approaching deadline. Therefore, we should get a response by tomorrow morning with an approval or denial. Patient verbalizes understanding.

## 2014-08-07 MED ORDER — PANCRELIPASE (LIP-PROT-AMYL) 40000-136000 UNITS PO CPEP: 1.0000 | ORAL_CAPSULE | Freq: Two times a day (BID) | ORAL | Status: DC

## 2014-08-07 NOTE — Telephone Encounter (Signed)
Patient's insurance has denied Creon. They require that she try zenpep and generic pancrealipase first. Please advise.Marland KitchenMarland KitchenMarland Kitchen

## 2014-08-07 NOTE — Telephone Encounter (Signed)
zenpep with same sig should be just as good

## 2014-08-07 NOTE — Addendum Note (Signed)
Addended by: Larina Bras on: 08/07/2014 12:09 PM   Modules accepted: Orders

## 2014-08-07 NOTE — Telephone Encounter (Signed)
Per Dr Hilarie Fredrickson, give patient Zenpep 40,000 units twice daily. Patient has been advised of the information below and verbalizes understanding.

## 2014-08-09 ENCOUNTER — Ambulatory Visit: Payer: Self-pay | Admitting: Licensed Clinical Social Worker

## 2014-08-22 ENCOUNTER — Ambulatory Visit: Payer: Self-pay | Admitting: Licensed Clinical Social Worker

## 2014-08-29 ENCOUNTER — Ambulatory Visit (INDEPENDENT_AMBULATORY_CARE_PROVIDER_SITE_OTHER): Payer: BLUE CROSS/BLUE SHIELD | Admitting: Licensed Clinical Social Worker

## 2014-08-29 DIAGNOSIS — F332 Major depressive disorder, recurrent severe without psychotic features: Secondary | ICD-10-CM

## 2014-09-12 ENCOUNTER — Ambulatory Visit (INDEPENDENT_AMBULATORY_CARE_PROVIDER_SITE_OTHER): Payer: BLUE CROSS/BLUE SHIELD | Admitting: Licensed Clinical Social Worker

## 2014-09-12 DIAGNOSIS — F332 Major depressive disorder, recurrent severe without psychotic features: Secondary | ICD-10-CM

## 2014-09-26 ENCOUNTER — Ambulatory Visit: Payer: BLUE CROSS/BLUE SHIELD | Admitting: Licensed Clinical Social Worker

## 2014-10-24 ENCOUNTER — Ambulatory Visit: Payer: BLUE CROSS/BLUE SHIELD | Admitting: Licensed Clinical Social Worker

## 2014-11-04 ENCOUNTER — Other Ambulatory Visit: Payer: Self-pay | Admitting: Internal Medicine

## 2014-11-12 ENCOUNTER — Encounter: Payer: Self-pay | Admitting: *Deleted

## 2014-11-14 ENCOUNTER — Ambulatory Visit (INDEPENDENT_AMBULATORY_CARE_PROVIDER_SITE_OTHER): Payer: BLUE CROSS/BLUE SHIELD | Admitting: Licensed Clinical Social Worker

## 2014-11-14 DIAGNOSIS — F332 Major depressive disorder, recurrent severe without psychotic features: Secondary | ICD-10-CM

## 2014-11-15 ENCOUNTER — Telehealth: Payer: Self-pay | Admitting: Internal Medicine

## 2014-11-15 NOTE — Telephone Encounter (Signed)
Pt states she was seen in ER at [facility] for abdominal pain. States she was told to follow-up with Dr. Hilarie Fredrickson. Pt is scheduled for OV with Dr. Raquel James 12/11/14@10 :30am. Pt wanted to know if she could be seen sooner. Offered pt an appt sooner with an app but pt wants to see Dr. Hilarie Fredrickson. Pt instructed to call back if she needs to be seen sooner and we can work him in with a PA. Pt verbalized understanding.

## 2014-11-28 ENCOUNTER — Ambulatory Visit: Payer: BLUE CROSS/BLUE SHIELD | Admitting: Licensed Clinical Social Worker

## 2014-12-11 ENCOUNTER — Encounter: Payer: Self-pay | Admitting: Internal Medicine

## 2014-12-11 ENCOUNTER — Ambulatory Visit (INDEPENDENT_AMBULATORY_CARE_PROVIDER_SITE_OTHER): Payer: BLUE CROSS/BLUE SHIELD | Admitting: Internal Medicine

## 2014-12-11 VITALS — BP 138/74 | HR 86 | Ht 65.5 in | Wt 170.0 lb

## 2014-12-11 DIAGNOSIS — F419 Anxiety disorder, unspecified: Secondary | ICD-10-CM

## 2014-12-11 DIAGNOSIS — R1084 Generalized abdominal pain: Secondary | ICD-10-CM | POA: Diagnosis not present

## 2014-12-11 DIAGNOSIS — K589 Irritable bowel syndrome without diarrhea: Secondary | ICD-10-CM

## 2014-12-11 DIAGNOSIS — F32A Depression, unspecified: Secondary | ICD-10-CM

## 2014-12-11 DIAGNOSIS — F418 Other specified anxiety disorders: Secondary | ICD-10-CM

## 2014-12-11 DIAGNOSIS — F329 Major depressive disorder, single episode, unspecified: Secondary | ICD-10-CM

## 2014-12-11 MED ORDER — HYOSCYAMINE SULFATE 0.125 MG SL SUBL: SUBLINGUAL_TABLET | SUBLINGUAL | Status: DC

## 2014-12-11 NOTE — Progress Notes (Signed)
Subjective:    Patient ID: Traci Johnston, female    DOB: [birth year], 54 y.o.   MRN: [id number]  HPI Traci Johnston is a 54 yo female with PMH of H. pylori gastritis confirmed eradication, IBS, gallstones status post cholecystectomy with post cholecystectomy mild biliary dilatation, history of kidney stones, renal oncocytoma status post resection, considerable anxiety and depression who seen for follow-up. He is here today for her husband and was last seen in July 2015.    She reports today she is feeling okay but she continues to struggle with intermittent abdominal discomfort, borborygmi, and nausea. Some abdominal bloating but this seems to be improved because she is continuing to take align as a probiotic. She did develop acute epigastric abdominal pain associated with violent nausea, vomiting and diarrhea in May 2016. This led to an ER visit in [facility]. Workup was largely unremarkable. Repeat CT scan was performed on 11/12/2014 which showed prior cholecystectomy. Mild biliary ductal dilatation likely related to post cholecystectomy state. No other acute findings. She followed up with primary care and was retreated with Prevpac for H. Pylori.  She is using Zenpep 1 capsule twice daily but not taking it in association with meals. She is using MiraLAX 17 g each day. She's taking omeprazole 20 mg twice daily. She's taking align daily. She is using Carafate rarely and as needed, Zofran as needed and Percocet as needed. She is now on Estrogen replacement pill rather than patch.  She is working with Kathyrn Lass with behavioral health. She continues to have anxiety and depressed mood. She is going to travel to [place] in the next several weeks to be with her son. His [employer] team moved from [place] to [place] and she is visiting. He still reports stress regarding her sister.  Review of Systems As per HPI, otherwise normal  Current Medications, Allergies, Past Medical History, Past Surgical History, Family  History and Social History were reviewed in [employer] record.     Objective:   Physical Exam BP 138/74 mmHg  Pulse 86  Ht 5' 5.5" (1.664 m)  Wt 170 lb (77.111 kg)  BMI 27.85 kg/m2 Constitutional: Well-developed and well-nourished. No distress. HEENT: Normocephalic and atraumatic. Oropharynx is clear and moist. No oropharyngeal exudate. Conjunctivae are normal.  No scleral icterus. Neck: Neck supple. Trachea midline. Cardiovascular: Normal rate, regular rhythm and intact distal pulses. No M/R/G Pulmonary/chest: Effort normal and breath sounds normal. No wheezing, rales or rhonchi. Abdominal: Soft, mild tenderness, no guarding or rebound, nondistended. Bowel sounds active throughout. There are no masses palpable. Extremities: no clubbing, cyanosis, or edema Lymphadenopathy: No cervical adenopathy noted. Neurological: Alert and oriented to person place and time. Skin: Skin is warm and dry. No rashes noted. Psychiatric: Normal mood and affect. Behavior is normal.  CT scan May 2016 - [place], reviewed      Assessment & Plan:   54 yo female with PMH of H. pylori gastritis confirmed eradication, IBS, gallstones status post cholecystectomy with post cholecystectomy mild biliary dilatation, history of kidney stones, renal oncocytoma status post resection, considerable anxiety and depression who seen for follow-up.  1. IBS/abd pain -- current symptoms are consistent with known irritable bowel disease. We have discussed this. I'm going to adjust how she is using her medications as currently it is not ideal. The acute episode of nausea, vomiting and diarrhea which occurred in May was very likely toxin mediated food poisoning versus viral gastroenteritis.  I'm going to change her pancreatic enzyme  replacement to 2 capsules before meals only. I explained this will not benefit her if taken without food. Decrease MiraLAX to every other day as previously using this 3 days a  week resulted in better bowel movements. It sounds like she's having intermittent diarrhea when using it daily. Prescription for Levsin 0.125 mg 1-2 tabs every 6 hours as needed for spasm type abdominal discomfort. Carafate and Zofran can be used as needed. Continue daily probiotic.  2. Depression/anxiety -- continued close relationship with mental health provider recommended. She plans to maintain this relationship  Return in 3-4 months, sooner if necessary 30 minutes spent with patient today

## 2014-12-11 NOTE — Patient Instructions (Addendum)
Dr. Hilarie Fredrickson wants to make the following medicine changes :  Take 2 tablets of your zenpep before meals.                                                                                                     Take Miralax every other day.                                                                                                  Continue your omeprazole 20mg  , one capsule twice a day before breakfast and supper                                                                                                    Continue taking Align                                                                                                                                                                                Use the zofran as needed  We have sent the following medications to your pharmacy for you to pick up at your convenience: Levsin  Follow up with Dr. Hilarie Fredrickson in 3 months.     I appreciate the opportunity to care for you.

## 2014-12-13 ENCOUNTER — Telehealth: Payer: Self-pay | Admitting: Internal Medicine

## 2014-12-13 MED ORDER — PANCRELIPASE (LIP-PROT-AMYL) 40000-136000 UNITS PO CPEP: ORAL_CAPSULE | ORAL | Status: DC

## 2014-12-13 NOTE — Telephone Encounter (Signed)
Prescription sent to patient's pharmacy.

## 2014-12-21 ENCOUNTER — Emergency Department (HOSPITAL_BASED_OUTPATIENT_CLINIC_OR_DEPARTMENT_OTHER)
Admission: EM | Admit: 2014-12-21 | Discharge: 2014-12-21 | Disposition: A | Discharge status: Home / Self Care | Payer: BLUE CROSS/BLUE SHIELD | Attending: Emergency Medicine | Admitting: Emergency Medicine

## 2014-12-21 ENCOUNTER — Encounter (HOSPITAL_BASED_OUTPATIENT_CLINIC_OR_DEPARTMENT_OTHER): Payer: Self-pay | Admitting: *Deleted

## 2014-12-21 ENCOUNTER — Emergency Department (HOSPITAL_BASED_OUTPATIENT_CLINIC_OR_DEPARTMENT_OTHER): Payer: BLUE CROSS/BLUE SHIELD

## 2014-12-21 DIAGNOSIS — R112 Nausea with vomiting, unspecified: Secondary | ICD-10-CM | POA: Diagnosis not present

## 2014-12-21 DIAGNOSIS — R1084 Generalized abdominal pain: Secondary | ICD-10-CM | POA: Insufficient documentation

## 2014-12-21 DIAGNOSIS — Z90710 Acquired absence of both cervix and uterus: Secondary | ICD-10-CM | POA: Diagnosis not present

## 2014-12-21 DIAGNOSIS — K219 Gastro-esophageal reflux disease without esophagitis: Secondary | ICD-10-CM | POA: Insufficient documentation

## 2014-12-21 DIAGNOSIS — F329 Major depressive disorder, single episode, unspecified: Secondary | ICD-10-CM | POA: Insufficient documentation

## 2014-12-21 DIAGNOSIS — Z87891 Personal history of nicotine dependence: Secondary | ICD-10-CM | POA: Insufficient documentation

## 2014-12-21 DIAGNOSIS — Z87442 Personal history of urinary calculi: Secondary | ICD-10-CM | POA: Insufficient documentation

## 2014-12-21 DIAGNOSIS — Z86018 Personal history of other benign neoplasm: Secondary | ICD-10-CM | POA: Insufficient documentation

## 2014-12-21 DIAGNOSIS — Z9049 Acquired absence of other specified parts of digestive tract: Secondary | ICD-10-CM | POA: Insufficient documentation

## 2014-12-21 DIAGNOSIS — R109 Unspecified abdominal pain: Secondary | ICD-10-CM

## 2014-12-21 DIAGNOSIS — Z9889 Other specified postprocedural states: Secondary | ICD-10-CM | POA: Diagnosis not present

## 2014-12-21 DIAGNOSIS — Z79899 Other long term (current) drug therapy: Secondary | ICD-10-CM | POA: Insufficient documentation

## 2014-12-21 DIAGNOSIS — Z8619 Personal history of other infectious and parasitic diseases: Secondary | ICD-10-CM | POA: Diagnosis not present

## 2014-12-21 DIAGNOSIS — F419 Anxiety disorder, unspecified: Secondary | ICD-10-CM | POA: Insufficient documentation

## 2014-12-21 DIAGNOSIS — R197 Diarrhea, unspecified: Secondary | ICD-10-CM | POA: Insufficient documentation

## 2014-12-21 LAB — CBC WITH DIFFERENTIAL/PLATELET
BASOS ABS: 0 10*3/uL (ref 0.0–0.1)
Basophils Relative: 0 % (ref 0–1)
Eosinophils Absolute: 0 10*3/uL (ref 0.0–0.7)
Eosinophils Relative: 0 % (ref 0–5)
HCT: 41.5 % (ref 36.0–46.0)
Hemoglobin: 13.2 g/dL (ref 12.0–15.0)
Lymphocytes Relative: 15 % (ref 12–46)
Lymphs Abs: 1.3 10*3/uL (ref 0.7–4.0)
MCH: 27.8 pg (ref 26.0–34.0)
MCHC: 31.8 g/dL (ref 30.0–36.0)
MCV: 87.4 fL (ref 78.0–100.0)
Monocytes Absolute: 0.3 10*3/uL (ref 0.1–1.0)
Monocytes Relative: 3 % (ref 3–12)
NEUTROS PCT: 82 % — AB (ref 43–77)
Neutro Abs: 6.6 10*3/uL (ref 1.7–7.7)
Platelets: 241 10*3/uL (ref 150–400)
RBC: 4.75 MIL/uL (ref 3.87–5.11)
RDW: 13.1 % (ref 11.5–15.5)
WBC: 8.2 10*3/uL (ref 4.0–10.5)

## 2014-12-21 LAB — COMPREHENSIVE METABOLIC PANEL
ALBUMIN: 4.3 g/dL (ref 3.5–5.0)
ALT: 12 U/L — ABNORMAL LOW (ref 14–54)
ANION GAP: 14 (ref 5–15)
AST: 19 U/L (ref 15–41)
Alkaline Phosphatase: 96 U/L (ref 38–126)
BUN: 16 mg/dL (ref 6–20)
CO2: 24 mmol/L (ref 22–32)
CREATININE: 0.93 mg/dL (ref 0.44–1.00)
Calcium: 9.9 mg/dL (ref 8.9–10.3)
Chloride: 102 mmol/L (ref 101–111)
GFR calc Af Amer: >60 mL/min (ref >60)
GFR calc non Af Amer: >60 mL/min (ref >60)
Glucose, Bld: 135 mg/dL — ABNORMAL HIGH (ref 65–99)
Potassium: 3.8 mmol/L (ref 3.5–5.1)
Sodium: 140 mmol/L (ref 135–145)
Total Bilirubin: 0.6 mg/dL (ref 0.3–1.2)
Total Protein: 7.9 g/dL (ref 6.5–8.1)

## 2014-12-21 LAB — LIPASE, BLOOD: Lipase: 24 U/L (ref 22–51)

## 2014-12-21 MED ORDER — HYDROMORPHONE HCL 1 MG/ML IJ SOLN
INTRAMUSCULAR | Status: AC
  Filled 2014-12-21: qty 1

## 2014-12-21 MED ORDER — OXYCODONE-ACETAMINOPHEN 5-325 MG PO TABS: 1.0000 | ORAL_TABLET | Freq: Four times a day (QID) | ORAL | Status: DC | PRN

## 2014-12-21 MED ORDER — HYDROMORPHONE HCL 1 MG/ML IJ SOLN
1.0000 mg | Freq: Once | INTRAMUSCULAR | Status: AC
Start: 2014-12-21 — End: 2014-12-21
  Administered 2014-12-21: 1 mg via INTRAVENOUS

## 2014-12-21 MED ORDER — FAMOTIDINE IN NACL 20-0.9 MG/50ML-% IV SOLN
20.0000 mg | Freq: Once | INTRAVENOUS | Status: AC
  Administered 2014-12-21: 20 mg via INTRAVENOUS
  Filled 2014-12-21: qty 50

## 2014-12-21 MED ORDER — HYDROMORPHONE HCL 1 MG/ML IJ SOLN
1.0000 mg | Freq: Once | INTRAMUSCULAR | Status: AC
  Administered 2014-12-21: 1 mg via INTRAVENOUS
  Filled 2014-12-21: qty 1

## 2014-12-21 MED ORDER — ONDANSETRON HCL 4 MG/2ML IJ SOLN
INTRAMUSCULAR | Status: AC
  Administered 2014-12-21: 4 mg
  Filled 2014-12-21: qty 2

## 2014-12-21 MED ORDER — DICYCLOMINE HCL 10 MG/ML IM SOLN
20.0000 mg | Freq: Once | INTRAMUSCULAR | Status: AC
  Administered 2014-12-21: 20 mg via INTRAMUSCULAR
  Filled 2014-12-21: qty 2

## 2014-12-21 MED ORDER — SODIUM CHLORIDE 0.9 % IV BOLUS (SEPSIS)
1000.0000 mL | Freq: Once | INTRAVENOUS | Status: AC
  Administered 2014-12-21: 1000 mL via INTRAVENOUS

## 2014-12-21 NOTE — ED Notes (Signed)
Pt appears less restless and moaning at this time, closing eyes at intervals, cont on cardiac monitor, enviornment changes made, pt reassured

## 2014-12-21 NOTE — ED Notes (Signed)
MD at bedside. 

## 2014-12-21 NOTE — Discharge Instructions (Signed)
Abdominal Pain, Women °Abdominal (stomach, pelvic, or belly) pain can be caused by many things. It is important to tell your doctor: °· The location of the pain. °· Does it come and go or is it present all the time? °· Are there things that start the pain (eating certain foods, exercise)? °· Are there other symptoms associated with the pain (fever, nausea, vomiting, diarrhea)? °All of this is helpful to know when trying to find the cause of the pain. °CAUSES  °· Stomach: virus or bacteria infection, or ulcer. °· Intestine: appendicitis (inflamed appendix), regional ileitis (Crohn's disease), ulcerative colitis (inflamed colon), irritable bowel syndrome, diverticulitis (inflamed diverticulum of the colon), or cancer of the stomach or intestine. °· Gallbladder disease or stones in the gallbladder. °· Kidney disease, kidney stones, or infection. °· Pancreas infection or cancer. °· Fibromyalgia (pain disorder). °· Diseases of the female organs: °¨ Uterus: fibroid (non-cancerous) tumors or infection. °¨ Fallopian tubes: infection or tubal pregnancy. °¨ Ovary: cysts or tumors. °¨ Pelvic adhesions (scar tissue). °¨ Endometriosis (uterus lining tissue growing in the pelvis and on the pelvic organs). °¨ Pelvic congestion syndrome (female organs filling up with blood just before the menstrual period). °¨ Pain with the menstrual period. °¨ Pain with ovulation (producing an egg). °¨ Pain with an IUD (intrauterine device, birth control) in the uterus. °¨ Cancer of the female organs. °· Functional pain (pain not caused by a disease, may improve without treatment). °· Psychological pain. °· Depression. °DIAGNOSIS  °Your doctor will decide the seriousness of your pain by doing an examination. °· Blood tests. °· X-rays. °· Ultrasound. °· CT scan (computed tomography, special type of X-ray). °· MRI (magnetic resonance imaging). °· Cultures, for infection. °· Barium enema (dye inserted in the large intestine, to better view it with  X-rays). °· Colonoscopy (looking in intestine with a lighted tube). °· Laparoscopy (minor surgery, looking in abdomen with a lighted tube). °· Major abdominal exploratory surgery (looking in abdomen with a large incision). °TREATMENT  °The treatment will depend on the cause of the pain.  °· Many cases can be observed and treated at home. °· Over-the-counter medicines recommended by your caregiver. °· Prescription medicine. °· Antibiotics, for infection. °· Birth control pills, for painful periods or for ovulation pain. °· Hormone treatment, for endometriosis. °· Nerve blocking injections. °· Physical therapy. °· Antidepressants. °· Counseling with a psychologist or psychiatrist. °· Minor or major surgery. °HOME CARE INSTRUCTIONS  °· Do not take laxatives, unless directed by your caregiver. °· Take over-the-counter pain medicine only if ordered by your caregiver. Do not take aspirin because it can cause an upset stomach or bleeding. °· Try a clear liquid diet (broth or water) as ordered by your caregiver. Slowly move to a bland diet, as tolerated, if the pain is related to the stomach or intestine. °· Have a thermometer and take your temperature several times a day, and record it. °· Bed rest and sleep, if it helps the pain. °· Avoid sexual intercourse, if it causes pain. °· Avoid stressful situations. °· Keep your follow-up appointments and tests, as your caregiver orders. °· If the pain does not go away with medicine or surgery, you may try: °¨ Acupuncture. °¨ Relaxation exercises (yoga, meditation). °¨ Group therapy. °¨ Counseling. °SEEK MEDICAL CARE IF:  °· You notice certain foods cause stomach pain. °· Your home care treatment is not helping your pain. °· You need stronger pain medicine. °· You want your IUD removed. °· You feel faint or   lightheaded. °· You develop nausea and vomiting. °· You develop a rash. °· You are having side effects or an allergy to your medicine. °SEEK IMMEDIATE MEDICAL CARE IF:  °· Your  pain does not go away or gets worse. °· You have a fever. °· Your pain is felt only in portions of the abdomen. The right side could possibly be appendicitis. The left lower portion of the abdomen could be colitis or diverticulitis. °· You are passing blood in your stools (bright red or black tarry stools, with or without vomiting). °· You have blood in your urine. °· You develop chills, with or without a fever. °· You pass out. °MAKE SURE YOU:  °· Understand these instructions. °· Will watch your condition. °· Will get help right away if you are not doing well or get worse. °Document Released: 04/04/2007 Document Revised: 10/22/2013 Document Reviewed: 04/24/2009 °[employer]® Patient Information ©2015 [employer]. This information is not intended to replace advice given to you by your health care provider. Make sure you discuss any questions you have with your health care provider. ° °

## 2014-12-21 NOTE — ED Notes (Signed)
Patient transported to X-ray via stretcher, sr x 2 up 

## 2014-12-21 NOTE — ED Notes (Signed)
Vomiting noted in triage

## 2014-12-21 NOTE — ED Provider Notes (Signed)
CSN: [id number]     Arrival date & time 12/21/14  1730 History  This chart was scribed for Blanchie Dessert, MD by Julien Nordmann, ED Scribe. This patient was seen in room MH04/MH04 and the patient's care was started at 5:55 PM.    Chief Complaint  Patient presents with  . Emesis     The history is provided by the patient. No language interpreter was used.    HPI Comments: Traci Johnston is a 54 y.o. female who has hx of IBS, anxiety, kidney stones, and gall stones presents to the Emergency Department complaining of abdominal pain onset 6 hours ago. Pt has associated vomiting and diarrhea. Per daughter, pt notes having a smoothie and 30 minutes after she started having abdominal pain and vomiting.  Pt has persistent intermittent ongoing abdominal pain for some time but this time seems worse.  She saw Dr. Hilarie Fredrickson this week and started some new meds. Pt denies hematemesis. Pt is allergic to aspirin.  Past Medical History  Diagnosis Date  . IBS (irritable bowel syndrome)   . Anxiety   . History of kidney stones   . GERD (gastroesophageal reflux disease)     hx. "Hypylori"  . 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 Common bile duct dilatation   . Helicobacter pylori gastritis   . Gallstones   . Depression    Past Surgical History  Procedure Laterality Date  . Abdominal hysterectomy    . Appendectomy    . Colonoscopy    . Cholecystectomy    . Umbilical hernia repair      6 grade  . Robot assisted laparoscopic nephrectomy Right 01/08/2013    Procedure: ROBOTIC ASSISTED LAPAROSCOPIC NEPHRECTOMY;  Surgeon: Dutch Gray, MD;  Location: [facility];  Service: Urology;  Laterality: Right;  PARTIAL NEPHRECTOMY    Family History  Problem Relation Age of Onset  . Colon cancer Neg Hx   . Prostate cancer Father   . Breast cancer Mother   . Breast cancer Sister    History  Substance Use Topics  . Smoking status: Former Smoker -- 0.50 packs/day for 25 years    Types: Cigarettes    Quit date:  06/02/2012  . Smokeless tobacco: Never Used     Comment: 05-2012///Will use electronic cigarette   . Alcohol Use: Yes     Comment: rare occ.   OB History    No data available     Review of Systems  A complete 10 system review of systems was obtained and all systems are negative except as noted in the HPI and PMH.    Allergies  Aspirin  Home Medications   Prior to Admission medications   Medication Sig Start Date End Date Taking? Authorizing Provider  bifidobacterium infantis (ALIGN) capsule Take 1 capsule by mouth daily. 12/31/13   Jerene Bears, MD  CARAFATE 1 GM/10ML suspension Take 1 g by mouth as needed.  10/25/13   Historical Provider, MD  FLUoxetine (PROZAC) 40 MG capsule Take 40 mg by mouth daily.    Historical Provider, MD  hyoscyamine (LEVSIN SL) 0.125 MG SL tablet Take 1 -2 tablets every 6 hours for spasms 12/11/14   Jerene Bears, MD  MINIVELLE 0.1 MG/24HR Place 1 patch onto the skin every other day. As directed 10/29/12   Historical Provider, MD  Omeprazole 20 MG TBEC Take 1 tablet by mouth 2 (two) times daily.    Historical Provider, MD  ondansetron (ZOFRAN-ODT) 4 MG disintegrating tablet Take 1  tablet (4 mg total) by mouth every 8 (eight) hours as needed for nausea or vomiting. 11/05/13   Jerene Bears, MD  oxyCODONE-acetaminophen (PERCOCET) 10-325 MG per tablet Take one tab every 4-6 hours as needed for abdominal pain. 01/10/13   Debbrah Alar, PA-C  Pancrelipase, Lip-Prot-Amyl, (ZENPEP) 40000 UNITS CPEP Take 2 capsules before meals 12/13/14   Jerene Bears, MD  polyethylene glycol (MIRALAX / GLYCOLAX) packet Take 17 g by mouth every other day.     Historical Provider, MD   Triage vitals: BP 146/91 mmHg  Pulse 47  Resp 18  Ht 5\' 8"  (1.727 m)  Wt 170 lb (77.111 kg)  BMI 25.85 kg/m2  SpO2 100% Physical Exam  Constitutional: She is oriented to person, place, and time. She appears well-developed and well-nourished. No distress.  Pt is moaning, crying, screaming on exam  HENT:   Head: Normocephalic.  Eyes: Conjunctivae are normal. Pupils are equal, round, and reactive to light. No scleral icterus.  Neck: Normal range of motion. Neck supple. No thyromegaly present.  Cardiovascular: Normal rate and regular rhythm.  Exam reveals no gallop and no friction rub.   No murmur heard. Pulmonary/Chest: Effort normal and breath sounds normal. No respiratory distress. She has no wheezes. She has no rales.  Abdominal: Soft. Bowel sounds are normal. She exhibits no distension. There is tenderness. There is guarding. There is no rebound.  No CVA tenderness, diffuse tenderness  Musculoskeletal: Normal range of motion.  Neurological: She is alert and oriented to person, place, and time.  Skin: Skin is warm and dry. No rash noted.  Psychiatric: She has a normal mood and affect. Her behavior is normal.  Nursing note and vitals reviewed.   ED Course  Procedures  DIAGNOSTIC STUDIES: Oxygen Saturation is 100% on RA, normal by my interpretation.  COORDINATION OF CARE:  6:00 PM Discussed treatment plan which includes nausea and pain medicationwith pt at bedside and pt agreed to plan.  Labs Review Labs Reviewed  CBC WITH DIFFERENTIAL/PLATELET - Abnormal; Notable for the following:    Neutrophils Relative % 82 (*)    All other components within normal limits  COMPREHENSIVE METABOLIC PANEL - Abnormal; Notable for the following:    Glucose, Bld 135 (*)    ALT 12 (*)    All other components within normal limits  LIPASE, BLOOD  URINALYSIS, ROUTINE W REFLEX MICROSCOPIC (NOT AT [facility])    Imaging Review Dg Abd Acute W/chest  12/21/2014   CLINICAL DATA:  Abdominal pain, onset 6 hours ago. Vomiting. History of irritable bowel syndrome, kidney stones, and gallstones.  EXAM: DG ABDOMEN ACUTE W/ 1V CHEST  COMPARISON:  01/11/2013 and the CT of 11/12/2014.  FINDINGS: Frontal view of the chest demonstrates convex right thoracic spine curvature. Numerous leads and wires project over the chest.  Midline trachea. Cardiomegaly accentuated by AP portable technique. No pleural effusion or pneumothorax. Clear lungs.  Abdominal films demonstrate no free intraperitoneal air or significant air-fluid levels on upright positioning. Moderate amount of right-sided stool on supine imaging. No gaseous distension of small bowel loops. Cholecystectomy clips. Probable phleboliths in the pelvis.  IMPRESSION: Possible constipation.  No acute findings.  Cardiomegaly without congestive failure.   Electronically Signed   By: Abigail Miyamoto M.D.   On: 12/21/2014 18:43     EKG Interpretation   Date/Time:  Saturday December 21 2014 17:57:59 EDT Ventricular Rate:  48 PR Interval:  162 QRS Duration: 82 QT Interval:  494 QTC Calculation: 441 R Axis:  77 Text Interpretation:  Sinus bradycardia Possible Left atrial enlargement  Left ventricular hypertrophy Confirmed by Maryan Rued  MD, Loree Fee ([id number]) on  12/21/2014 6:37:28 PM      MDM   Final diagnoses:  Abdominal pain  Nausea vomiting and diarrhea   patient presents with abrupt onset of abdominal pain that started around noon today after drinking a smoothie. As reported by daughter patient has had intermittent abdominal pain for some time but it seems worse today. Patient is screaming and moaning clutching her abdomen when I go in to examine her. She has diffuse tenderness but she points mostly to the epigastric region. Patient has been persistently bradycardic here in the 40s but with an unchanged EKG from prior. Patient denies any chest pain or shortness of breath. She does have a history of IBS which this could be an episode versus perforated viscus versus pancreatitis.  Patient is status post cholecystectomy.  CBC and CMP are within normal limits. Lipase within normal limits. Acute abdominal series shows possible constipation but no other acute findings. After pain medicine, fluids and Prevacid.  10:42 PM Pt 's pain is more controlled and she is feeling better.   Will d/c home to f/u with Dr. Hilarie Fredrickson. I personally performed the services described in this documentation, which was scribed in my presence.  The recorded information has been reviewed and considered.   Blanchie Dessert, MD 12/21/14 [phone]

## 2014-12-21 NOTE — ED Notes (Signed)
Pt returns from radiology, placed back on cont cardiac monitoring 

## 2014-12-21 NOTE — ED Notes (Signed)
EKG done, placed on cardiac monitor, cont POX monitoring

## 2014-12-21 NOTE — ED Notes (Signed)
Pt taken BR by wheelchair upon arrival per her request- did not "do anything" per her daughter

## 2014-12-21 NOTE — ED Notes (Signed)
Pt presents with abd pain and nausea, pt crying out, drawing up knees

## 2014-12-21 NOTE — ED Notes (Signed)
12 lead EKG done with EDP in room, EDP reviewed

## 2014-12-21 NOTE — ED Notes (Signed)
IVF NS up (#2) at [facility]

## 2014-12-24 ENCOUNTER — Telehealth: Payer: Self-pay | Admitting: Internal Medicine

## 2014-12-24 ENCOUNTER — Telehealth: Payer: Self-pay

## 2014-12-24 ENCOUNTER — Ambulatory Visit (INDEPENDENT_AMBULATORY_CARE_PROVIDER_SITE_OTHER): Payer: BLUE CROSS/BLUE SHIELD | Admitting: Nurse Practitioner

## 2014-12-24 ENCOUNTER — Encounter: Payer: Self-pay | Admitting: Nurse Practitioner

## 2014-12-24 ENCOUNTER — Ambulatory Visit: Payer: Self-pay | Admitting: Nurse Practitioner

## 2014-12-24 VITALS — BP 118/82 | HR 65 | Ht 65.5 in | Wt 170.8 lb

## 2014-12-24 DIAGNOSIS — K589 Irritable bowel syndrome without diarrhea: Secondary | ICD-10-CM

## 2014-12-24 DIAGNOSIS — R1010 Upper abdominal pain, unspecified: Secondary | ICD-10-CM | POA: Diagnosis not present

## 2014-12-24 DIAGNOSIS — R1013 Epigastric pain: Secondary | ICD-10-CM | POA: Diagnosis not present

## 2014-12-24 MED ORDER — OXYCODONE-ACETAMINOPHEN 10-325 MG PO TABS: 1.0000 | ORAL_TABLET | Freq: Four times a day (QID) | ORAL | Status: DC | PRN

## 2014-12-24 NOTE — Progress Notes (Addendum)
History of Present Illness:  Patient is a 54 year old female known to Dr. Hilarie Fredrickson. She has a history of irritable bowel syndrome, H. pylori gastritis, and cholelithiasis status post cholecystectomy. Patient states she has had gastrointestinal problems all her life but in the last 5 years they have become much more pronounced. She struggles with anxiety / depression managed by [facility]. Her GI symptoms have been worked up extensively with imaging studies, upper and lower endoscopies by [place] and also at [facility]. She  had an unremarkable EUS late May at [facility] for evaluation of dilated bile ducts on CTscan. Patient frustrated at lack of answers and persistent upper abdominal pain.   Patient was  just seen here 12/11/14 with persistent abdominal discomfort, borborygmia and nausea. Her pancreatic enzymes were changed to 2 capsules before meals. Her MiraLAX was decreased because of intermittent diarrhea, Carafate and Zofran were continued as needed, probiotic was continued, and a prescription for Levsin was given for spasm type pain. Levsin didn't help. Carafate has no obvious benefit. Patient is taking one Percocet but Effexor short lived often necessitating a second Percocet within a few hours.  Since above visit patient has been seen in the emergency department (3 days ago ) with ongoing constant upper abdominal pain, vomiting and diarrhea. She apparently "passed out" prior to ED arrival. CBC , CMETand lipase were normal. Abdominal films suggested constipation with moderate stool in the right colon. She was advised by ED to follow up with [place].  Patient here with family members.  She is in almost constant pain and scared to leave the house for fear of an unpredictable bowel movement. Stools sometimes loose, other times formed. When the urge to defecate comes she may have two or three BMs in rapid succession. The nausea is not terribly problematic, prn anti-emetics help. She hasn't lost  weight and is in fact baffled about weight gain.    Current Medications, Allergies, Past Medical History, Past Surgical History, Family History and Social History were reviewed in [employer] record.  Studies:   Dg Abd Acute W/chest  27-Dec-2014   CLINICAL DATA:  Abdominal pain, onset 6 hours ago. Vomiting. History of irritable bowel syndrome, kidney stones, and gallstones.  EXAM: DG ABDOMEN ACUTE W/ 1V CHEST  COMPARISON:  01/11/2013 and the CT of 11/12/2014.  FINDINGS: Frontal view of the chest demonstrates convex right thoracic spine curvature. Numerous leads and wires project over the chest. Midline trachea. Cardiomegaly accentuated by AP portable technique. No pleural effusion or pneumothorax. Clear lungs.  Abdominal films demonstrate no free intraperitoneal air or significant air-fluid levels on upright positioning. Moderate amount of right-sided stool on supine imaging. No gaseous distension of small bowel loops. Cholecystectomy clips. Probable phleboliths in the pelvis.  IMPRESSION: Possible constipation.  No acute findings.  Cardiomegaly without congestive failure.   Electronically Signed   By: Traci Johnston M.D.   On: 12/27/2014 18:43    Physical Exam: General: Well developed , black female in no acute distress Head: Normocephalic and atraumatic Eyes:  sclerae anicteric, conjunctiva pink  Ears: Normal auditory acuity Lungs: Clear throughout to auscultation Heart: Regular rate and rhythm Abdomen: Soft, non distended, upper abdomen tender to even light palpation.  No masses, no hepatomegaly. Normal bowel sounds Musculoskeletal: Symmetrical with no gross deformities  Extremities: No edema  Neurological: Alert oriented x 4, grossly nonfocal Psychological:  Alert and cooperative. Normal mood and affect  Assessment and Recommendations:  54 year old female with  chronic upper abdominal pain, intermittent nausea and frequently altered bowel habits. She has been diagnosed  with IBS. Extensive workup has been unrevealing for any other etiology. Patient and family upset about lack of answers and want to know how to manage the pain. This is my first time seeing patient today but I did speak with her primary GI, Dr. Hilarie Fredrickson.  Plan is to:   refer patient for a second opinion. I offered [facility] or [facility], she prefers the latter.   Arrange for gastric emptying study (off narcotics)  Refer to Pain Management  Temporarily increase Percocet to 10/325 one Q6-8 hours prn  Recommended patient get in contact with her [place] provider as unresolved GI symptoms may be adding to her depression   Addendum: Reviewed and agree with management. Pt also with recent abd/pelvis CT scan which was unrevealing as to source of chronic upper abd pain  Traci Bears, MD

## 2014-12-24 NOTE — Telephone Encounter (Signed)
Pt states she was seen in the ER over the weekend for terrible abdominal pain above her belly button all across her abdomen. Pt also had some nausea and vomiting. Pt states she was told to follow-up with GI. Pt scheduled to see Tye Savoy NP today at 2pm. Pt aware of appt.

## 2014-12-24 NOTE — Telephone Encounter (Signed)
Faxed referral to pain clinic. Waiting for response.

## 2014-12-24 NOTE — Patient Instructions (Signed)
You have been scheduled for a gastric emptying scan at [facility] on 01/14/2015 at 7:30. Please arrive at least 15 minutes prior to your appointment for registration. Please make certain not to have anything to eat or drink after midnight the night before your test. Hold all stomach medications (ex: Zofran, phenergan, Reglan) 48 hours prior to your test. If you need to reschedule your appointment, please contact radiology scheduling at [phone]. _____________________________________________________________________ A gastric-emptying study measures how long it takes for food to move through your stomach. There are several ways to measure stomach emptying. In the most common test, you eat food that contains a small amount of radioactive material. A scanner that detects the movement of the radioactive material is placed over your abdomen to monitor the rate at which food leaves your stomach. This test normally takes about 2 hours to complete. _____________________________________________________________________   We have sent medications to your pharmacy for you to pick up at your convenience.  We will contact you with information on your referral to [facility] and the Pain management clinic.

## 2014-12-30 NOTE — Telephone Encounter (Signed)
Called patient to inform her about appointment with [facility] On 01/29/2015 with Dr Jerene Pitch at 9:30am. Left voicemail for pt to call back.

## 2014-12-30 NOTE — Telephone Encounter (Signed)
Pt called back and was informed on her appointment with [name]. Patient understands.   Traci Johnston she was denied an appt qith pain management. What is the next step? Patient also said she is not taking Estradiol 1mg  once daily.

## 2014-12-30 NOTE — Telephone Encounter (Signed)
Received fax from pain management stating that Dr. Vira Blanco decided not to accept the referral. If there are any questions to contact the office.

## 2014-12-31 NOTE — Telephone Encounter (Signed)
Amber,  I will forward this to Dr. Hilarie Fredrickson but is there another pain management group in town or somewhere close like [place]? If so please try and get her in. Thanks

## 2014-12-31 NOTE — Telephone Encounter (Signed)
Would try another pain management group in town, [facility]

## 2014-12-31 NOTE — Telephone Encounter (Signed)
Amber, can you try [facility]. Thanks

## 2015-01-01 NOTE — Telephone Encounter (Signed)
Sent. I am waiting for a response.

## 2015-01-06 NOTE — Telephone Encounter (Signed)
Called [facility] and left voicemail for referral coordinator in regards to the patient referral we sent over last week.

## 2015-01-08 NOTE — Telephone Encounter (Signed)
Called [facility] and left another voicemail for Tiffany the referral coordinator in reference to [employer] referral.

## 2015-01-09 NOTE — Telephone Encounter (Signed)
Thank you, please keep trying.

## 2015-01-09 NOTE — Telephone Encounter (Signed)
Traci Johnston,  I keep calling to speak with someone in referrals and I always get sent to a voicemail. I have left 2 messages and have no received a return phone call or fax with information about this referral. What is the next step?

## 2015-01-09 NOTE — Telephone Encounter (Signed)
Called [facility] and talked to another employee. He states the referral coordinator is on vacation but the patient's referral is still under review

## 2015-01-14 ENCOUNTER — Encounter (HOSPITAL_COMMUNITY)
Admission: RE | Admit: 2015-01-14 | Discharge: 2015-01-14 | Disposition: A | Discharge status: Home / Self Care | Payer: BLUE CROSS/BLUE SHIELD | Source: Ambulatory Visit | Attending: Nurse Practitioner | Admitting: Nurse Practitioner

## 2015-01-14 DIAGNOSIS — R1013 Epigastric pain: Secondary | ICD-10-CM | POA: Diagnosis present

## 2015-01-14 MED ORDER — TECHNETIUM TC 99M SULFUR COLLOID
2.1000 | Freq: Once | INTRAVENOUS | Status: AC | PRN
  Administered 2015-01-14: 2.1 via INTRAVENOUS

## 2015-01-16 ENCOUNTER — Telehealth: Payer: Self-pay | Admitting: Nurse Practitioner

## 2015-01-16 NOTE — Telephone Encounter (Signed)
Patient notified of normal GES.  She is scheduled to see [facility] on 01/27/15

## 2015-01-16 NOTE — Telephone Encounter (Signed)
Called [facility] and the referral coordinator told me that the doctor was unsure of why the patient was being referred to the pain clinic. I asked if they received the last note from our office and he stated yes. I told him the referral was for constant abdominal pain/epi gastric pain. So it will go back into review.

## 2015-01-17 NOTE — Telephone Encounter (Signed)
Will forward this note to primary GI, Dr. Hilarie Fredrickson. This is second [facility] we have tried to get patient into. FYI, her gastric emptying study was normal.

## 2015-01-24 ENCOUNTER — Telehealth: Payer: Self-pay | Admitting: Internal Medicine

## 2015-01-24 NOTE — Telephone Encounter (Signed)
Patient is scheduled with Dr. Jerene Pitch at [facility] on 01/29/15.  See notes from [employer], [place] from 12/24/14

## 2015-01-29 DIAGNOSIS — R1031 Right lower quadrant pain: Secondary | ICD-10-CM | POA: Insufficient documentation

## 2015-03-29 ENCOUNTER — Other Ambulatory Visit: Payer: Self-pay | Admitting: Internal Medicine

## 2015-04-13 ENCOUNTER — Other Ambulatory Visit: Payer: Self-pay | Admitting: Internal Medicine

## 2015-04-14 NOTE — Telephone Encounter (Signed)
Dr Hilarie Fredrickson- Patient requests refills of zofran (last rx written 10/2013). Please see notes from care everywhere [facility]). Do you want me to refill?

## 2015-04-18 ENCOUNTER — Telehealth: Payer: Self-pay | Admitting: Internal Medicine

## 2015-04-18 NOTE — Telephone Encounter (Signed)
Dr. Jerene Pitch contacted me directly by phone today. He has seen Mrs. Traci Johnston in consultation. CT enterography was performed recently which was unremarkable. 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 Dr. Jerene Pitch also previous he performed EUS without specific or pathologic finding. She was tested by breath test for bacterial overgrowth and this was negative. There was no evidence of Crohn's disease on the CT enterography She was tried on IBgard wi is going to discuss her case with  no real improvement. Dr. Jerene Pitch feels majority of symptoms are functional in nature Dr. Tanna Furry is going to further discuss the biliary ductal dilation with his advanced biliary colleague (Dr. Amalia Hailey) to see if ERCP would be helpful.  This is unclear and certainly high risk for possible complication such as pancreatitis.  If performed this would need to be done at [facility] and not at [facility] I feel she should wait to hear from Dr. Jerene Pitch regarding ERCP. I am happy to see her in office follow-up but do not have new recommendations for her at this time. I think she needs to continue treatment for IBS (certainly a component to her symptoms) and also follow-up with her mental health provider given her history of depression and anxiety.  She was previously referred for pain management eval, but I am not sure this took place

## 2015-04-18 NOTE — Telephone Encounter (Signed)
Pt states she has been seeing Dr. Jerene Pitch at [facility] and she had some abnormal tests and Dr. Jerene Pitch wants her to see Dr. Hilarie Fredrickson to possibly have a procedure. States Dr. Jerene Pitch is supposed to be call and speak with Dr. Hilarie Fredrickson. Results are in care everywhere. Please advise.    1. Status post cholecystectomy with dilatation of the main pancreatic duct and moderate intrahepatic biliary dilatation. Recommend laboratory correlation for biliary obstruction. Findings may reflect distal common duct calculus or ampullary stricture. 2. No evidence of bowel inflammation. Moderate volume retained colonic stool.

## 2015-04-21 NOTE — Telephone Encounter (Signed)
Spoke with pt and her husband and they are aware. Request appt to discuss ERCP with Dr. Hilarie Fredrickson. State she is still having a lot of pain but has not been to pain clinic. Pt scheduled to see Dr. Hilarie Fredrickson 04/29/15@10 :15am. Pt aware of appt.

## 2015-04-29 ENCOUNTER — Telehealth: Payer: Self-pay | Admitting: Internal Medicine

## 2015-04-29 ENCOUNTER — Encounter: Payer: Self-pay | Admitting: Internal Medicine

## 2015-04-29 ENCOUNTER — Ambulatory Visit (INDEPENDENT_AMBULATORY_CARE_PROVIDER_SITE_OTHER): Payer: BLUE CROSS/BLUE SHIELD | Admitting: Internal Medicine

## 2015-04-29 VITALS — BP 130/88 | HR 64 | Ht 65.5 in | Wt 173.4 lb

## 2015-04-29 DIAGNOSIS — R109 Unspecified abdominal pain: Secondary | ICD-10-CM | POA: Diagnosis not present

## 2015-04-29 DIAGNOSIS — G8929 Other chronic pain: Secondary | ICD-10-CM

## 2015-04-29 DIAGNOSIS — K838 Other specified diseases of biliary tract: Secondary | ICD-10-CM | POA: Diagnosis not present

## 2015-04-29 DIAGNOSIS — K589 Irritable bowel syndrome without diarrhea: Secondary | ICD-10-CM | POA: Diagnosis not present

## 2015-04-29 MED ORDER — HYOSCYAMINE SULFATE 0.125 MG SL SUBL: SUBLINGUAL_TABLET | SUBLINGUAL | Status: DC

## 2015-04-29 MED ORDER — PANCRELIPASE (LIP-PROT-AMYL) 40000-136000 UNITS PO CPEP: ORAL_CAPSULE | ORAL | Status: DC

## 2015-04-29 MED ORDER — AMITRIPTYLINE HCL 50 MG PO TABS: 50.0000 mg | ORAL_TABLET | Freq: Every day | ORAL | Status: DC

## 2015-04-29 MED ORDER — ONDANSETRON 4 MG PO TBDP: ORAL_TABLET | ORAL | Status: DC

## 2015-04-29 NOTE — Patient Instructions (Signed)
Please follow a gluten free diet x 6 weeks. We have given you a brochure regarding this.  Discontinue Miralax for now.  Continue pancrealipase 2 capsules with each meal.  Continue Levsin and Zofran when needed.  We have sent the following medications to your pharmacy for you to pick up at your convenience: Amitriptyline 50 mg every night.  Please follow up with Kathyrn Lass @ [place].  Follow up with Dr Hilarie Fredrickson as needed.

## 2015-04-29 NOTE — Progress Notes (Signed)
Subjective:    Patient ID: Traci Johnston, female    DOB: [birth year], 54 y.o.   MRN: [id number]  HPI Traci Johnston is a 54 year old female with a past medical history of IBS, H. pylori gastritis status post treatment, cholelithiasis status post cholecystectomy, bile duct dilation felt secondary to prior cholecystectomy who is seen in follow-up. She was last seen in our office by Tye Savoy, NP on 12/24/2014. She has continued to have epigastric abdominal pain and also mid abdominal pain. This is somewhat episodic and she describes that she has "some good and some bad days". Bowel movements vary from constipated to very loose. Occasionally she has issues with feeling incomplete evacuation. No rectal bleeding or melena. Appetite fluctuates. She does have issues with nausea and rare vomiting. She is currently taking align once daily, Levsin as needed, Zofran as needed. She takes Zenpep 1 capsule twice a day which she finds helpful but was previously taking 2 capsules with meals which was more helpful. She takes MiraLAX every other day. She is on Pristiq for depression and takes Ambien on occasion for sleep. She continues to feel depressed mood and has issue with falling asleep and staying asleep.  She was seen by Dr. Jerene Pitch for a second opinion at [facility]. This involved multiple other tests including a normal CT enterography showing no evidence of IBD. Breath testing negative for bacterial overgrowth. CT scan did show persistent common duct dilation which has ranged from 12-17 mm over the last several years. Prior EUS was without abnormality. Dr. Jerene Pitch felt that this was most likely irritable bowel. He did ask Dr. Amalia Hailey about possibility of sphincter of oddi dysfunction and ERCP which was felt to be high risk and likely unhelpful. She also tried over-the-counter IBgard which did not help.  She was seen by Kathyrn Lass with behavioral health but has not followed up recently while her workup was ongoing  in [place] As per HPI, otherwise negative  Current Medications, Allergies, Past Medical History, Past Surgical History, Family History and Social History were reviewed in [facility] record.     Objective:   Physical Exam BP 130/88 mmHg  Pulse 64  Ht 5' 5.5" (1.664 m)  Wt 173 lb 6.4 oz (78.654 kg)  BMI 28.41 kg/m2 Constitutional: Well-developed and well-nourished. No distress. HEENT: Normocephalic and atraumatic. Oropharynx is clear and moist. No oropharyngeal exudate. Conjunctivae are normal.  No scleral icterus. Neck: Neck supple. Trachea midline. Cardiovascular: Normal rate, regular rhythm and intact distal pulses. No M/R/G Pulmonary/chest: Effort normal and breath sounds normal. No wheezing, rales or rhonchi. Abdominal: Soft, nontender, nondistended. Bowel sounds active throughout. There are no masses palpable. No hepatosplenomegaly. Extremities: no clubbing, cyanosis, or edema Lymphadenopathy: No cervical adenopathy noted. Neurological: Alert and oriented to person place and time. Skin: Skin is warm and dry. No rashes noted. Psychiatric: Normal mood and affect. Behavior is normal.  [facility] records reviewed     Assessment & Plan:  54 year old female with a past medical history of IBS, H. pylori gastritis status post treatment, cholelithiasis status post cholecystectomy, bile duct dilation felt secondary to prior cholecystectomy who is seen in follow-up.  1. IBS/abd pain CBD dilation -- she has been through extensive workup with me, a gastroenterologist in [facility], and now most recently Dr. Jerene Pitch at [facility].  Multiple tests have been unrevealing. 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 I agree with Dr. Hoover Brunette and  Dr. Amalia Hailey that ERCP with biliary sphincterotomy would be high risk for her particularly with pancreatitis and complications. She understands this today. With that in mind we need to  focus on managing her irritable bowel. I explained that narcotics frequently worsen irritable bowel and I would like to avoid them. She will continue align 1 capsule daily as a probiotic. I would like her to do a 6 week trial of a strict gluten-free diet to see if this improves her overall symptoms. She will hold MiraLAX because I'm not sure she needs a laxative at present as she is having diarrhea also will days per week. I'm going to increase her Zenpep back to 2 capsules with meals as this worked better for her. I'm going to start amitriptyline 50 mg at bedtime to help with sleep and also IBS pain. I want her to call and get an appointment with Kathyrn Lass for follow-up as there is certainly psychological factors at play which worsens her overall symptoms. She will continue Levsin and Zofran on an as-needed basis  I will see her back in 2-3 months time, sooner if necessary 45 minutes spent with the patient and her husband today

## 2015-04-29 NOTE — Telephone Encounter (Signed)
Left message for patient to call back. Dr Hilarie Fredrickson states he will see her in 2-3 months. She has been scheduled for a follow up appointment on 07/18/15 @ 9:00 am.

## 2015-04-30 NOTE — Telephone Encounter (Signed)
I have spoken to patient to give her information below. She verbalizes understanding and has read back appointment date and time to me.

## 2015-05-13 ENCOUNTER — Ambulatory Visit: Payer: Self-pay | Admitting: Licensed Clinical Social Worker

## 2015-05-21 ENCOUNTER — Telehealth: Payer: Self-pay | Admitting: Internal Medicine

## 2015-05-22 NOTE — Telephone Encounter (Signed)
Pt states she was in [place] recently and was hospitalized for pneumonia and her "attacks" of abdominal pain. Pt states she was told to follow-up with her GI doctor asap. Pt scheduled to see Dr. Hilarie Fredrickson tomorrow at [facility]. Pt aware of appt.

## 2015-05-23 ENCOUNTER — Ambulatory Visit (INDEPENDENT_AMBULATORY_CARE_PROVIDER_SITE_OTHER): Payer: BLUE CROSS/BLUE SHIELD | Admitting: Internal Medicine

## 2015-05-23 ENCOUNTER — Encounter: Payer: Self-pay | Admitting: Internal Medicine

## 2015-05-23 VITALS — BP 122/70 | HR 70 | Ht 65.5 in | Wt 168.0 lb

## 2015-05-23 DIAGNOSIS — K589 Irritable bowel syndrome without diarrhea: Secondary | ICD-10-CM

## 2015-05-23 DIAGNOSIS — F4323 Adjustment disorder with mixed anxiety and depressed mood: Secondary | ICD-10-CM | POA: Diagnosis not present

## 2015-05-23 DIAGNOSIS — R1013 Epigastric pain: Secondary | ICD-10-CM | POA: Diagnosis not present

## 2015-05-23 DIAGNOSIS — K838 Other specified diseases of biliary tract: Secondary | ICD-10-CM | POA: Diagnosis not present

## 2015-05-23 NOTE — Progress Notes (Signed)
Subjective:    Patient ID: Traci Johnston, female    DOB: 12-14-1960, 54 y.o.   MRN: [id number]  HPI Traci Johnston is a 54 year old female with past medical history of IBS, H. pylori gastritis status post treatment, cholelithiasis status post cholecystectomy, chronic biliary ductal dilatation is here for follow-up. She's here today with her husband and granddaughter. She was last seen on 04/29/2015 where we discussed her irritable bowel, chronic abdominal pain, episodic abdominal pain, biliary ductal dilatation, anxiety and depression. At that time the decision was made to have gluten-free diet trial, continue pancreatic enzyme replacement. Both she and her husband feel that this dietary change was working very well for several weeks and she was feeling better, eating better and having less abdominal discomfort.  She traveled to [facility] for one of her son's football games and for Thanksgiving holiday. She went out Monday before Thanksgiving and by Wednesday was feeling "queasy stomach". She took Levsin without benefit. Throughout the day she began to have more issue with nausea and then developed severe epigastric abdominal pain associated with violent vomiting. This was followed by diarrhea. She was transported to the hospital by EMS and admitted to [facility] in [place]. There she remained hospitalized from Wednesday to Sunday. She reports being treated for pneumonia. I also reported discussing doing a "balloon dilation" of one of her bile ducts. She reports bile ducts were found to be dilated to "17". She was discharged on Sunday and flew back to [place] this Monday. She is not discharged with antibiotics for pneumonia. She states she was found to be hypertensive during the hospital stay and was given a prescription for amlodipine 5 mg. Her primary care is continuing this for now.  She reports that she seems to be improving. She's not had any further intense abdominal pain  or "attacks". She is eating gluten-free diet. No further diarrhea, in fact no bowel movement this week. She is using Zenpep 2 capsules with meals. She lives in fear of these attacks returning. Her husband notes that very recently was the two-year anniversary of her mother's death.  She reports still having issues with anxiety and depression. She did not necessarily feel overly anxious when she arrived in [place].  She has not followed up with Traci Johnston her behavioral health provider recently. She is to call for an appointment and states "I need to".   Review of Systems As per history of present illness, otherwise negative  Current Medications, Allergies, Past Medical History, Past Surgical History, Family History and Social History were reviewed in [employer] record.     Objective:   Physical Exam BP 122/70 mmHg  Pulse 70  Ht 5' 5.5" (1.664 m)  Wt 168 lb (76.204 kg)  BMI 27.52 kg/m2 Constitutional: Well-developed and well-nourished. No distress. HEENT: Normocephalic and atraumatic. Marland Kitchen Conjunctivae are normal.  No scleral icterus. Neck: Neck supple. Trachea midline. Cardiovascular: Normal rate, regular rhythm and intact distal pulses. No M/R/G Pulmonary/chest: Effort normal and breath sounds normal. No wheezing, rales or rhonchi. Abdominal: Soft, nontender, nondistended. Bowel sounds active throughout. There are no masses palpable. No hepatosplenomegaly. Extremities: no clubbing, cyanosis, or edema Neurological: Alert and oriented to person place and time. Skin: Skin is warm and dry. Psychiatric: Normal mood and flat affect. Behavior is normal.  Hospital records reviewed. I do not have records of any cross-sectional imaging. Gallbladder was done showing prior cholecystectomy and dilated common bile duct. Lipase was normal.  These records will  be scanned     Assessment & Plan:  54 year old female with past medical history of IBS, H. pylori gastritis status  post treatment, cholelithiasis status post cholecystectomy, chronic biliary ductal dilatation is here for follow-up.   1. Episodic epigastric pain/biliary ductal dilation/IBS/anxiety and depression -- unfortunately she has again been hospitalized with severe epigastric abdominal pain. This is in the background of severe IBS, anxiety and depression. She seems to have been benefiting from gluten-free diet and pancreatic enzyme replacement. I don't know if her admission is secondary to a panic attack type situation where she developed severe abdominal pain as the primary manifestation. In the differential remains SOD Type II (her liver enzymes have not been elevated, but he bile duct certainly is enlarged).  We have discussed treatment for sphincter dysfunction with biliary sphincterotomy and the risks associated with this procedure. Risk include pancreatitis which could be complicated and life-threatening. Post ERCP pancreatitis risk for her is elevated compared to standard ERCP. If performed this would need evaluated and done at [facility] likely with Dr. Jerl Santos (or other biliary attending) given her history at that institution. After our long discussion today she prefers to continue gluten-free diet and pancreatic enzyme replacement. If she has another attack, certainly another hospitalization, I feel she needs to be reevaluated by hepatobiliary gastroenterologist at [facility].   --Continue amitriptyline 50 mg at bedtime --Continue Zenpep 2 capsules with meals --Continue gluten-free diet --Call Traci Johnston for follow-up with behavioral health. Question if a short acting benzodiazepine may help prevent progression of symptoms to the need for hospitalization, if symptoms are anxiety provoked?? --Continue MiraLAX on an as-needed basis for constipation --Follow-up with me in 2-3 months --Notify me of any further attacks  40 minutes spent with the patient today. Greater than 50% was spent in counseling and  coordination of care with the patient

## 2015-05-23 NOTE — Patient Instructions (Signed)
Please continue a gluten free diet.  Continue Zenpep 2 capsules with meals.  Please call Kathyrn Lass, your psychologist to schedule a follow up appointment.  Continue all current medications.  Let us know if your abdominal pain continues or worsens.

## 2015-05-27 ENCOUNTER — Encounter: Payer: Self-pay | Admitting: Internal Medicine

## 2015-06-03 ENCOUNTER — Ambulatory Visit: Payer: BLUE CROSS/BLUE SHIELD | Admitting: Licensed Clinical Social Worker

## 2015-07-10 ENCOUNTER — Ambulatory Visit: Payer: BLUE CROSS/BLUE SHIELD | Admitting: Licensed Clinical Social Worker

## 2015-07-18 ENCOUNTER — Encounter: Payer: Self-pay | Admitting: Internal Medicine

## 2015-07-18 ENCOUNTER — Ambulatory Visit (INDEPENDENT_AMBULATORY_CARE_PROVIDER_SITE_OTHER): Payer: BLUE CROSS/BLUE SHIELD | Admitting: Internal Medicine

## 2015-07-18 VITALS — BP 132/76 | HR 72 | Ht 65.5 in | Wt 179.2 lb

## 2015-07-18 DIAGNOSIS — B354 Tinea corporis: Secondary | ICD-10-CM

## 2015-07-18 DIAGNOSIS — F32A Depression, unspecified: Secondary | ICD-10-CM

## 2015-07-18 DIAGNOSIS — R1013 Epigastric pain: Secondary | ICD-10-CM

## 2015-07-18 DIAGNOSIS — F418 Other specified anxiety disorders: Secondary | ICD-10-CM

## 2015-07-18 DIAGNOSIS — K589 Irritable bowel syndrome without diarrhea: Secondary | ICD-10-CM | POA: Diagnosis not present

## 2015-07-18 DIAGNOSIS — F329 Major depressive disorder, single episode, unspecified: Secondary | ICD-10-CM

## 2015-07-18 DIAGNOSIS — F419 Anxiety disorder, unspecified: Secondary | ICD-10-CM

## 2015-07-18 DIAGNOSIS — G8929 Other chronic pain: Secondary | ICD-10-CM

## 2015-07-18 MED ORDER — CLOTRIMAZOLE-BETAMETHASONE 1-0.05 % EX CREA: 1.0000 "application " | TOPICAL_CREAM | Freq: Two times a day (BID) | CUTANEOUS | Status: DC

## 2015-07-18 MED ORDER — OXYCODONE-ACETAMINOPHEN 10-325 MG PO TABS: 1.0000 | ORAL_TABLET | Freq: Four times a day (QID) | ORAL | Status: DC | PRN

## 2015-07-18 NOTE — Patient Instructions (Addendum)
Continue medications as directed (Zenpep, Omeprazole,and amitriptyline) Continue to follow Gluten free diet Follow up in 6 months Take Levsin, Miralax and Zofran as needed We have sent the following medications to your pharmacy for you to pick up at your convenience: percocet

## 2015-07-18 NOTE — Progress Notes (Signed)
Subjective:    Patient ID: Traci Johnston, female    DOB: [birth year], 55 y.o.   MRN: [id number]  HPI Traci Johnston is a 55 year old female with history of IBS, H. pylori gastritis status post treatment, gallstones status post cholecystectomy, chronic postcholecystectomy biliary ductal dilation, anxiety and depression who is here for follow-up. She presents alone today. She was last seen on 12-16. She reports since last visit she feels significantly better. She feels that the diet changes made a "huge difference". She is eating a gluten-free diet and continues to avoid red meat. She's had no further episodes of abdominal pain. She feels that she is functioning better. She has established with a new mental health provider, Dr. Lemar Livings at [facility].  She continues to take amitriptyline 50 mg at bedtime, Zofran on an as-needed basis, Levsin on an as-needed basis. She is using omeprazole 20 mg twice daily. She also continues Zenpep 2 capsules before meals. Bowel habits have been more regular recently and she is only needing MiraLAX on an as-needed basis. Has gained about 10 pounds and she is somewhat concerned by this. No blood in her stool or melena. No fevers or chills. She has noticed an itchy rash under her right breast   Review of Systems As per history of present illness, otherwise negative  Current Medications, Allergies, Past Medical History, Past Surgical History, Family History and Social History were reviewed in [employer] record.     Objective:   Physical Exam BP 132/76 mmHg  Pulse 72  Ht 5' 5.5" (1.664 m)  Wt 179 lb 4 oz (81.307 kg)  BMI 29.36 kg/m2 Constitutional: Well-developed and well-nourished. No distress. HEENT: Normocephalic and atraumatic. Marland Kitchen Conjunctivae are normal.  No scleral icterus. Neck: Neck supple. Trachea midline. Cardiovascular: Normal rate, regular rhythm and intact distal pulses. No M/R/G Pulmonary/chest: Effort normal and breath sounds  normal. No wheezing, rales or rhonchi. Abdominal: Soft, nontender, nondistended. Bowel sounds active throughout. There are no masses palpable. No hepatosplenomegaly.  Extremities: no clubbing, cyanosis, or edema Neurological: Alert and oriented to person place and time. Skin: Skin is warm and dry. Hyperpigmented somewhat scaly rash with several satellite lesions under the right breast most consistent with tinea Psychiatric: Normal mood and affect. Behavior is normal.      Assessment & Plan:  55 year old female with history of IBS, H. pylori gastritis status post treatment, gallstones status post cholecystectomy, chronic postcholecystectomy biliary ductal dilation, anxiety and depression who is here for follow-up.  1. IBS/chronic epigastric pain -- she is doing significantly better today which is great news. She's had a very positive response to gluten-free diet which I have recommended that she continue strictly. She will continue to avoid red meat which was previously given her trouble. We discussed her weight gain which is likely secondary to increased caloric intake now that she is feeling better and not having as much trouble with abdominal pain. It also sounds like she has established a positive relationship with mental health provider which will also help IBS --Continue Zenpep 2 caps with meals --Amitriptyline 50 mg daily at bedtime --Omeprazole 20 g twice a day --Zofran as needed --Levsin as needed, she is using this sparingly now --She requests refill of oxycodone for abdominal pain if needed. She is not needing this much and I think it provides her psychological benefit to know the medicine is available should and attack occur. Refill given for 20 tablets to be used rarely. Once she has confidence that she is  indeed better, she will not need this medication and I have told her we will not provide this drug long-term  2. History of constipation -- also improved with dietary change.  MiraLAX when necessary  3. Anxiety depression -- now seeing Dr. Lemar Livings  4. Tinea corporis -- Lotrisone cream for 5-7 days twice a day. Notify primary care if rash fails to resolve.  OV in 6 months, sooner if needed 25 minutes spent with the patient today. Greater than 50% was spent in counseling and coordination of care with the patient

## 2015-08-14 ENCOUNTER — Other Ambulatory Visit: Payer: Self-pay | Admitting: Internal Medicine

## 2015-09-17 ENCOUNTER — Telehealth: Payer: Self-pay | Admitting: *Deleted

## 2015-09-17 NOTE — Telephone Encounter (Deleted)
If you are age 54 or older, your body mass index should be between 23-30. Your BMI is  @lastBMI @. If this is out of the aforementioned range listed, please consider follow up with your Primary Care Provider.  If you are age 1 or younger, your body mass index should be between 19-25. Your BMI is @lastBMI @. If this is out of the aformentioned range listed, please consider follow up with your Primary Care Provider.

## 2015-09-17 NOTE — Telephone Encounter (Signed)
Patient's insurance company, [employer] has denied a prior authorization request for Hyoscyamine. Dr Hilarie Fredrickson, please advise.Marland KitchenMarland Kitchen

## 2015-09-17 NOTE — Telephone Encounter (Signed)
Bentyl could be substituted 20 mg 3 times a day when necessary

## 2015-09-18 MED ORDER — DICYCLOMINE HCL 20 MG PO TABS: 20.0000 mg | ORAL_TABLET | Freq: Three times a day (TID) | ORAL | Status: DC

## 2015-09-18 NOTE — Telephone Encounter (Signed)
Patient has been advised of information below. She verbalizes understanding. Rx for Bentyl sent to pharmacy.

## 2015-09-29 ENCOUNTER — Other Ambulatory Visit: Payer: Self-pay | Admitting: Internal Medicine

## 2015-11-27 ENCOUNTER — Other Ambulatory Visit: Payer: Self-pay | Admitting: Internal Medicine

## 2015-12-29 ENCOUNTER — Other Ambulatory Visit: Payer: Self-pay | Admitting: Internal Medicine

## 2016-01-07 ENCOUNTER — Ambulatory Visit (INDEPENDENT_AMBULATORY_CARE_PROVIDER_SITE_OTHER): Payer: BLUE CROSS/BLUE SHIELD | Admitting: Internal Medicine

## 2016-01-07 ENCOUNTER — Ambulatory Visit: Payer: Self-pay | Admitting: Internal Medicine

## 2016-01-07 ENCOUNTER — Encounter: Payer: Self-pay | Admitting: Internal Medicine

## 2016-01-07 VITALS — BP 124/78 | HR 80 | Ht 65.5 in | Wt 187.0 lb

## 2016-01-07 DIAGNOSIS — F418 Other specified anxiety disorders: Secondary | ICD-10-CM

## 2016-01-07 DIAGNOSIS — K589 Irritable bowel syndrome without diarrhea: Secondary | ICD-10-CM | POA: Diagnosis not present

## 2016-01-07 DIAGNOSIS — K219 Gastro-esophageal reflux disease without esophagitis: Secondary | ICD-10-CM | POA: Diagnosis not present

## 2016-01-07 DIAGNOSIS — F32A Depression, unspecified: Secondary | ICD-10-CM

## 2016-01-07 DIAGNOSIS — F419 Anxiety disorder, unspecified: Secondary | ICD-10-CM

## 2016-01-07 DIAGNOSIS — F329 Major depressive disorder, single episode, unspecified: Secondary | ICD-10-CM

## 2016-01-07 NOTE — Progress Notes (Signed)
Subjective:    Patient ID: Traci Johnston, female    DOB: 1960/12/30, 55 y.o.   MRN: [id number]  HPI Kenedee Bohac is a 55 year old female with history of irritable bowel syndrome, H. pylori gastritis status post treatment, history of gallstones status post cholecystectomy, chronic postcholecystectomy biliary ductal dilatation, anxiety and depression who is here for follow-up. She's here today with her sister. She was last seen in January 2017.  On the whole she feels that her symptoms are stable though she still will feel crampy pain in her stomach from time to time. She describes this as "knots in my stomach". She is only eating 1 large meal each evening and nothing throughout the day. She does drink coffee in the morning. She reports she simply doesn't have a great appetite in the morning. She's taking amitriptyline 50 mg "sometimes" at bedtime, estimated every other day. She does take daily probiotic. Omeprazole 20 mg twice a day and send Pap 2 capsules before her meal. She reports bowel movements most days but about one day a week she has a very large bowel movement that takes 30 minutes to an hour to pass. She reports stools as normal color without melena or rectal bleeding. She has started drinking a water "fusion" product that she makes at home but read about on Facebook. It contains than her, lemon, garlic and cucumber. She also adds honey. She is concerned about her weight gain. She has gained about 8 pounds since her last visit here. S continues to eat or gluten and lactose-free diethe  Her son was traded to the [employer] and she plans on going for games in the fall. She is also getting ready to move into her new home.  Review of Systems As per history of present illness, otherwise negative  Current Medications, Allergies, Past Medical History, Past Surgical History, Family History and Social History were reviewed in [employer] record.     Objective:   Physical Exam BP 124/78 mmHg  Pulse 80  Ht 5' 5.5" (1.664 m)  Wt 187 lb (84.823 kg)  BMI 30.63 kg/m2 Constitutional: Well-developed and well-nourished. No distress. HEENT: Normocephalic and atraumatic. Oropharynx is clear and moist. No oropharyngeal exudate. Conjunctivae are normal.  No scleral icterus. Neck: Neck supple. Trachea midline. Cardiovascular: Normal rate, regular rhythm and intact distal pulses. No M/R/G Pulmonary/chest: Effort normal and breath sounds normal. No wheezing, rales or rhonchi. Abdominal: Soft, Mild diffuse tenderness without rebound or guarding, nondistended. Bowel sounds active throughout. There are no masses palpable. No hepatosplenomegaly. Extremities: no clubbing, cyanosis, or edema Lymphadenopathy: No cervical adenopathy noted. Neurological: Alert and oriented to person place and time. Skin: Skin is warm and dry. No rashes noted. Psychiatric: Normal mood and flat affect. Behavior is normal.     Assessment & Plan:   55 year old female with history of irritable bowel syndrome, H. pylori gastritis status post treatment, history of gallstones status post cholecystectomy, chronic postcholecystectomy biliary ductal dilatation, anxiety and depression who is here for follow-up.   1. IBS/GERD -- She has significant irritable bowel syndrome with intermittent issues with abdominal pain. She has improved with dietary modifications and also the use of amitriptyline at bedtime, PPI, Zenpep and intermittent anti-spasmodic. I've encouraged her to use amitriptyline 50 mg on a scheduled basis at bedtime as I feel that this will benefit her IBS long-term. I also will have her start Benefiber 1 tablespoon daily to try to help improve the completeness to her bowel movement and avoid the  periodic large bowel movements which seem to take longer to empty/complete. She is taking her second dose of omeprazole at that time and I've asked that she move this to 30 minutes before dinner.  Continue Bentyl 20 mg 3 times a day when necessary for crampy abdominal pain and spasm. --We discussed her eating habits which do not sound completely healthy to me. She is eating 1 large meal in the evening and often overeating. I have encouraged her to eat a small meal in the morning or at mid day. Also focus on decreasing portion sizes at dinner in an effort to lose weight, which she is greatly interested in.   2. Anxiety and depression -- continue to see Dr. Lemar Livings with psychiatry  3. Constipation -- see #1. MiraLAX can be used if necessary  Return in 6-9 months, sooner if necessary 25 minutes spent with the patient today. Greater than 50% was spent in counseling and coordination of care with the patient

## 2016-01-07 NOTE — Patient Instructions (Signed)
Please purchase the following medications over the counter and take as directed: Benefiber 1 tablespoon every day  Please add in a smaller meal during the day in place of larger meal at night.  Continue Amitriptyline 50 mg at bedtime (take every night rather than as needed)  Continue Bentyl as needed.  Take your omeprazole before dinner.  Continue Zenpep.  Please follow up with Dr Hilarie Fredrickson in 4-6 months.  If you are age 54 or older, your body mass index should be between 23-30. Your Body mass index is 30.63 kg/(m^2). If this is out of the aforementioned range listed, please consider follow up with your Primary Care Provider.  If you are age 53 or younger, your body mass index should be between 19-25. Your Body mass index is 30.63 kg/(m^2). If this is out of the aformentioned range listed, please consider follow up with your Primary Care Provider.

## 2016-01-31 ENCOUNTER — Other Ambulatory Visit: Payer: Self-pay | Admitting: Internal Medicine

## 2016-02-04 ENCOUNTER — Emergency Department (HOSPITAL_BASED_OUTPATIENT_CLINIC_OR_DEPARTMENT_OTHER)
Admission: EM | Admit: 2016-02-04 | Discharge: 2016-02-04 | Disposition: A | Discharge status: Home / Self Care | Payer: BLUE CROSS/BLUE SHIELD | Attending: Emergency Medicine | Admitting: Emergency Medicine

## 2016-02-04 ENCOUNTER — Encounter (HOSPITAL_BASED_OUTPATIENT_CLINIC_OR_DEPARTMENT_OTHER): Payer: Self-pay | Admitting: Emergency Medicine

## 2016-02-04 DIAGNOSIS — Z79899 Other long term (current) drug therapy: Secondary | ICD-10-CM | POA: Diagnosis not present

## 2016-02-04 DIAGNOSIS — R197 Diarrhea, unspecified: Secondary | ICD-10-CM | POA: Diagnosis not present

## 2016-02-04 DIAGNOSIS — R6883 Chills (without fever): Secondary | ICD-10-CM | POA: Diagnosis not present

## 2016-02-04 DIAGNOSIS — R42 Dizziness and giddiness: Secondary | ICD-10-CM | POA: Insufficient documentation

## 2016-02-04 DIAGNOSIS — R1013 Epigastric pain: Secondary | ICD-10-CM | POA: Diagnosis present

## 2016-02-04 DIAGNOSIS — R112 Nausea with vomiting, unspecified: Secondary | ICD-10-CM | POA: Insufficient documentation

## 2016-02-04 DIAGNOSIS — Z87891 Personal history of nicotine dependence: Secondary | ICD-10-CM | POA: Diagnosis not present

## 2016-02-04 DIAGNOSIS — R1084 Generalized abdominal pain: Secondary | ICD-10-CM | POA: Insufficient documentation

## 2016-02-04 LAB — URINALYSIS, ROUTINE W REFLEX MICROSCOPIC
Bilirubin Urine: NEGATIVE
Glucose, UA: NEGATIVE mg/dL
Hgb urine dipstick: NEGATIVE
Ketones, ur: 15 mg/dL — AB
NITRITE: NEGATIVE
PROTEIN: NEGATIVE mg/dL
SPECIFIC GRAVITY, URINE: 1.022 (ref 1.005–1.030)
pH: 7 (ref 5.0–8.0)

## 2016-02-04 LAB — CBC WITH DIFFERENTIAL/PLATELET
BASOS ABS: 0 10*3/uL (ref 0.0–0.1)
BASOS PCT: 0 %
EOS ABS: 0 10*3/uL (ref 0.0–0.7)
EOS PCT: 0 %
HCT: 37.1 % (ref 36.0–46.0)
HEMOGLOBIN: 12.2 g/dL (ref 12.0–15.0)
LYMPHS ABS: 1.2 10*3/uL (ref 0.7–4.0)
Lymphocytes Relative: 14 %
MCH: 28.6 pg (ref 26.0–34.0)
MCHC: 32.9 g/dL (ref 30.0–36.0)
MCV: 87.1 fL (ref 78.0–100.0)
Monocytes Absolute: 0.3 10*3/uL (ref 0.1–1.0)
Monocytes Relative: 4 %
NEUTROS PCT: 82 %
Neutro Abs: 6.6 10*3/uL (ref 1.7–7.7)
PLATELETS: 259 10*3/uL (ref 150–400)
RBC: 4.26 MIL/uL (ref 3.87–5.11)
RDW: 13.6 % (ref 11.5–15.5)
WBC: 8.1 10*3/uL (ref 4.0–10.5)

## 2016-02-04 LAB — COMPREHENSIVE METABOLIC PANEL
ALK PHOS: 92 U/L (ref 38–126)
ALT: 12 U/L — ABNORMAL LOW (ref 14–54)
ANION GAP: 8 (ref 5–15)
AST: 16 U/L (ref 15–41)
Albumin: 3.8 g/dL (ref 3.5–5.0)
BUN: 14 mg/dL (ref 6–20)
CALCIUM: 9 mg/dL (ref 8.9–10.3)
CO2: 25 mmol/L (ref 22–32)
Chloride: 104 mmol/L (ref 101–111)
Creatinine, Ser: 0.97 mg/dL (ref 0.44–1.00)
Glucose, Bld: 121 mg/dL — ABNORMAL HIGH (ref 65–99)
Potassium: 3.9 mmol/L (ref 3.5–5.1)
SODIUM: 137 mmol/L (ref 135–145)
TOTAL PROTEIN: 6.9 g/dL (ref 6.5–8.1)
Total Bilirubin: 0.3 mg/dL (ref 0.3–1.2)

## 2016-02-04 LAB — URINE MICROSCOPIC-ADD ON

## 2016-02-04 LAB — LIPASE, BLOOD: LIPASE: 34 U/L (ref 11–51)

## 2016-02-04 MED ORDER — HALOPERIDOL LACTATE 5 MG/ML IJ SOLN
2.0000 mg | Freq: Once | INTRAMUSCULAR | Status: AC
  Administered 2016-02-04: 2 mg via INTRAVENOUS
  Filled 2016-02-04: qty 1

## 2016-02-04 MED ORDER — GI COCKTAIL ~~LOC~~
30.0000 mL | Freq: Once | ORAL | Status: AC
  Administered 2016-02-04: 30 mL via ORAL
  Filled 2016-02-04: qty 30

## 2016-02-04 MED ORDER — DICYCLOMINE HCL 10 MG PO CAPS
10.0000 mg | ORAL_CAPSULE | Freq: Once | ORAL | Status: AC
  Administered 2016-02-04: 10 mg via ORAL
  Filled 2016-02-04: qty 1

## 2016-02-04 MED ORDER — PROMETHAZINE HCL 25 MG PO TABS: 25.0000 mg | ORAL_TABLET | Freq: Four times a day (QID) | ORAL | 0 refills | Status: DC | PRN

## 2016-02-04 MED ORDER — SODIUM CHLORIDE 0.9 % IV BOLUS (SEPSIS)
1000.0000 mL | Freq: Once | INTRAVENOUS | Status: AC
  Administered 2016-02-04: 1000 mL via INTRAVENOUS

## 2016-02-04 MED ORDER — ONDANSETRON HCL 4 MG/2ML IJ SOLN
4.0000 mg | Freq: Once | INTRAMUSCULAR | Status: AC
  Administered 2016-02-04: 4 mg via INTRAVENOUS
  Filled 2016-02-04: qty 2

## 2016-02-04 MED ORDER — HYDROMORPHONE HCL 1 MG/ML IJ SOLN
1.0000 mg | Freq: Once | INTRAMUSCULAR | Status: AC
  Administered 2016-02-04: 1 mg via INTRAVENOUS
  Filled 2016-02-04: qty 1

## 2016-02-04 NOTE — ED Notes (Signed)
MD at bedside. 

## 2016-02-04 NOTE — ED Triage Notes (Signed)
Hx of abd problems. Sts she ate Activia today and has had epigastric pain ever since.  Vomiting and diarrhea since 2pm. Is taking abx prescribed by GI at this time.  Sts she was told she has IBS.

## 2016-02-04 NOTE — ED Provider Notes (Signed)
[facility] DEPT [facility] Provider Note   CSN: [id number] Arrival date & time: 02/04/16  1740  By signing my name below, I, Irene Pap, attest that this documentation has been prepared under the direction and in the presence of Merrily Pew, MD. Electronically Signed: Irene Pap, ED Scribe. 02/04/16. 6:27 PM.  History   Chief Complaint Chief Complaint  Patient presents with  . Abdominal Pain   The history is provided by the patient. No language interpreter was used.  HPI Comments: Traci Johnston is a 55 y.o. Female with a hx of GERD, helicobacter pylori gastritis, cholecystectomy, appendectomy, nephrectomy, IBS, gallstones, and common bile duct dilation who presents to the Emergency Department complaining of constant, epigastric abdominal pain onset 4 hours ago. Pt states that she ate Activia yogurt and has been having symptoms ever since. She reports associated lightheadedness, chills, vomiting and diarrhea. Pt reports temporary relief with keeping her knees bent to her chest. Pt states that she was on the toilet for two hours and was using the bathroom the whole time. Pt is on a gluten free diet and typically only eats once a day, but was directed by her GI to try and eat more in the morning. She reports hx of similar symptoms, approximately every 6-8 months. She called the GI doctor who directed her to the ED for the symptoms. Pt is on Bentyl. She took it before eating the yogurt but it did not provide relief. Pt also took Zofran to no relief. Pt is currently out of her prescribed pain medication. She denies hx of heart disease, fever, dysuria, or rash.   Past Medical History:  Diagnosis Date  . Anxiety   . Common bile duct dilatation   . Depression   . Gallstones   . GERD (gastroesophageal reflux disease)    hx. "Hypylori"  . Helicobacter pylori gastritis   . History of kidney stones   . IBS (irritable bowel syndrome)   . 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 Upper abdominal pain 12/24/2014  . Constipation, chronic 11/05/2013  . 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 IBS (irritable bowel syndrome) 12/06/2012  . History of Helicobacter pylori infection 12/06/2012    Past Surgical History:  Procedure Laterality Date  . ABDOMINAL HYSTERECTOMY    . APPENDECTOMY    . CHOLECYSTECTOMY    . COLONOSCOPY    . ROBOT ASSISTED LAPAROSCOPIC NEPHRECTOMY Right 01/08/2013   Procedure: ROBOTIC ASSISTED LAPAROSCOPIC NEPHRECTOMY;  Surgeon: Dutch Gray, MD;  Location: [facility];  Service: Urology;  Laterality: Right;  PARTIAL NEPHRECTOMY   . UMBILICAL HERNIA REPAIR     6 grade    OB History    No data available       Home Medications    Prior to Admission medications   Medication Sig Start Date End Date Taking? Authorizing Provider  diazepam (VALIUM) 5 MG tablet Take 5 mg by mouth every 8 (eight) hours as needed for anxiety.   Yes Historical Provider, MD  amitriptyline (ELAVIL) 50 MG tablet TAKE 1 TABLET(50 MG) BY MOUTH AT BEDTIME 08/14/15   Jerene Bears, MD  bifidobacterium infantis (ALIGN) capsule Take 1 capsule by mouth daily. 12/31/13   Jerene Bears, MD  clotrimazole-betamethasone (LOTRISONE) cream Apply 1 application topically 2 (two) times daily. For 1 week 07/18/15   Jerene Bears, MD  dicyclomine (BENTYL) 20 MG tablet Take 1 tablet (20 mg total) by mouth  3 (three) times daily before meals. As needed 09/18/15   Jerene Bears, MD  estrogens, conjugated, (PREMARIN) 0.625 MG tablet Take 0.625 mg by mouth daily. Take daily for 21 days then do not take for 7 days.    Historical Provider, MD  Omeprazole 20 MG TBEC Take 1 tablet by mouth 2 (two) times daily.    Historical Provider, MD  ondansetron (ZOFRAN-ODT) 4 MG disintegrating tablet DISSOLVE ONE TABLET BY MOUTH EVERY 8 HOURS AS NEEDED FOR NAUSEA OR VOMITING 02/02/16   Jerene Bears, MD  oxyCODONE-acetaminophen (PERCOCET) 10-325 MG per tablet Take 1 tablet by  mouth every 6 (six) hours as needed for pain. 12/24/14   Willia Craze, NP  polyethylene glycol (MIRALAX / GLYCOLAX) packet Take 17 g by mouth every other day.     Historical Provider, MD  PRISTIQ 50 MG 24 hr tablet Take 50 mg by mouth daily. 04/03/15   Historical Provider, MD  promethazine (PHENERGAN) 25 MG tablet Take 1 tablet (25 mg total) by mouth every 6 (six) hours as needed for nausea or vomiting. 02/04/16   Merrily Pew, MD  ZENPEP 40000 units CPEP TAKE 2 CAPSULES BY MOUTH THREE TIMES DAILY BEFORE MEALS 12/30/15   Jerene Bears, MD  zolpidem (AMBIEN) 10 MG tablet Take 10 mg by mouth. 10/07/14   Historical Provider, MD    Family History Family History  Problem Relation Age of Onset  . Breast cancer Mother   . Prostate cancer Father   . Breast cancer Sister   . Colon cancer Neg Hx     Social History Social History  Substance Use Topics  . Smoking status: Former Smoker    Packs/day: 0.50    Years: 25.00    Types: Cigarettes    Quit date: 06/02/2012  . Smokeless tobacco: Never Used     Comment: 05-2012///Will use electronic cigarette   . Alcohol use Yes     Comment: rare occ.     Allergies   Aspirin   Review of Systems Review of Systems  Constitutional: Positive for chills. Negative for fever.  Gastrointestinal: Positive for abdominal pain, diarrhea, nausea and vomiting.  Genitourinary: Negative for dysuria.  Skin: Negative for rash.  Neurological: Positive for light-headedness.  All other systems reviewed and are negative.    Physical Exam Updated Vital Signs BP 126/75   Pulse 67   Temp 98.1 F (36.7 C) (Oral)   Resp 15   Ht 5\' 9"  (1.753 m)   Wt 180 lb (81.6 kg)   SpO2 100%   BMI 26.58 kg/m   Physical Exam  Constitutional: She is oriented to person, place, and time. She appears well-developed and well-nourished.  Appears uncomfortable  HENT:  Head: Normocephalic and atraumatic.  Eyes: EOM are normal. Pupils are equal, round, and reactive to light.    Neck: Normal range of motion. Neck supple.  Cardiovascular: Normal rate, regular rhythm and normal heart sounds.   Pulmonary/Chest: Effort normal and breath sounds normal. She has no wheezes.  Abdominal: Soft. There is generalized tenderness. There is no rebound, no guarding, no tenderness at McBurney's point and negative Murphy's sign.  Seems to be more comfortable with her knees up  Musculoskeletal: Normal range of motion.  Neurological: She is alert and oriented to person, place, and time.  Skin: Skin is warm and dry.  Psychiatric: She has a normal mood and affect. Her behavior is normal.  Nursing note and vitals reviewed.    ED Treatments / Results  DIAGNOSTIC STUDIES: Oxygen Saturation is 99% on RA, normal by my interpretation.    COORDINATION OF CARE: 6:20 PM-Discussed treatment plan which includes labs with pt at bedside and pt agreed to plan.   Labs (all labs ordered are listed, but only abnormal results are displayed) Labs Reviewed  COMPREHENSIVE METABOLIC PANEL - Abnormal; Notable for the following:       Result Value   Glucose, Bld 121 (*)    ALT 12 (*)    All other components within normal limits  URINALYSIS, ROUTINE W REFLEX MICROSCOPIC (NOT AT [facility]) - Abnormal; Notable for the following:    Ketones, ur 15 (*)    Leukocytes, UA TRACE (*)    All other components within normal limits  URINE MICROSCOPIC-ADD ON - Abnormal; Notable for the following:    Squamous Epithelial / LPF 0-5 (*)    Bacteria, UA RARE (*)    All other components within normal limits  LIPASE, BLOOD  CBC WITH DIFFERENTIAL/PLATELET    EKG  EKG Interpretation None      Radiology No results found.  Procedures Procedures (including critical care time)  Medications Ordered in ED Medications  HYDROmorphone (DILAUDID) injection 1 mg (1 mg Intravenous Given 02/04/16 1846)  gi cocktail (Maalox,Lidocaine,Donnatal) (30 mLs Oral Given 02/04/16 1846)  ondansetron (ZOFRAN) injection 4 mg (4 mg  Intravenous Given 02/04/16 1842)  dicyclomine (BENTYL) capsule 10 mg (10 mg Oral Given 02/04/16 1846)  sodium chloride 0.9 % bolus 1,000 mL (0 mLs Intravenous Stopped 02/04/16 2040)  haloperidol lactate (HALDOL) injection 2 mg (2 mg Intravenous Given 02/04/16 2050)    Initial Impression / Assessment and Plan / ED Course  I have reviewed the triage vital signs and the nursing notes.  Pertinent labs & imaging results that were available during my care of the patient were reviewed by me and considered in my medical decision making (see chart for details).  Clinical Course  Comment By Time  Likely IBS. No indication for CT. Will eval for intraabdominal pathology, ecg for qt and treat symptoms.  Merrily Pew, MD 08/16 1827    Labs ok. Symptoms eventually improved with haldol. Tolerating PO. Will continue to follow up with GI.   Final Clinical Impressions(s) / ED Diagnoses   Final diagnoses:  Generalized abdominal pain  I personally performed the services described in this documentation, which was scribed in my presence. The recorded information has been reviewed and is accurate.    New Prescriptions Discharge Medication List as of 02/04/2016  9:28 PM    START taking these medications   Details  promethazine (PHENERGAN) 25 MG tablet Take 1 tablet (25 mg total) by mouth every 6 (six) hours as needed for nausea or vomiting., Starting Wed 02/04/2016, Print         Merrily Pew, MD 02/05/16 0030

## 2016-02-06 ENCOUNTER — Telehealth: Payer: Self-pay | Admitting: Internal Medicine

## 2016-02-06 NOTE — Telephone Encounter (Signed)
Pt was seen in the ER for abdominal pain, diarrhea and vomiting. Pt states she was told to follow-up with Dr. Hilarie Fredrickson. Pt scheduled to see Dr. Norman Herrlich 04/20/16@11am . Pt aware of appt.

## 2016-02-23 ENCOUNTER — Other Ambulatory Visit: Payer: Self-pay | Admitting: Internal Medicine

## 2016-02-24 ENCOUNTER — Other Ambulatory Visit: Payer: Self-pay | Admitting: Internal Medicine

## 2016-04-20 ENCOUNTER — Encounter: Payer: Self-pay | Admitting: Internal Medicine

## 2016-04-20 ENCOUNTER — Ambulatory Visit (INDEPENDENT_AMBULATORY_CARE_PROVIDER_SITE_OTHER): Payer: BLUE CROSS/BLUE SHIELD | Admitting: Internal Medicine

## 2016-04-20 VITALS — BP 112/76 | HR 68 | Ht 65.5 in | Wt 193.4 lb

## 2016-04-20 DIAGNOSIS — K589 Irritable bowel syndrome without diarrhea: Secondary | ICD-10-CM

## 2016-04-20 NOTE — Progress Notes (Signed)
Subjective:    Patient ID: Traci Johnston, female    DOB: Jan 02, 1961, 55 y.o.   MRN: [id number]  HPI Traci Johnston is a 55 year old female with history of IBS, H. pylori gastritis status post treatment, history of gallstones status post cholecystectomy with chronic postcholecystectomy biliary ductal dilatation, anxiety and depression who is here for follow-up. She is here alone today and was last seen on 01/07/2016.  She has recently returned from her second trip to [place] to visit her son who is now playing for the [facility] [employer] dolphins. She finds these trips to be wonderful for her and she does very well without abdominal pain and very little anxiety and stress while she is there. She plans to go back soon. When she is home she continues to have intermittent upper abdominal pain which can be associated with nausea and occasional loose stools. When she has the upper abdominal pain she describes this as "knots". She continues to include a gluten-free diet and is avoiding beef. She did have loose stools last night after dinner for unclear reasons. She continues taking omeprazole 20 mg twice per day. She is using [employer] as a probiotic. Amitriptyline 50 mg at bedtime. She uses MiraLAX and dicyclomine along with Zofran on an as-needed basis. She also continues Zenpep 2 capsules before meals. With this regimen she feels on the whole her symptoms are controlled but she fears getting "sick again". She was in the ER with upper abdominal pain in August. Labs at this time were unremarkable and it was felt to be a flare of her chronic irritable bowel disease.  She does report increased stress and anxiety lately as we are nearing the time of your when she traumatically found her mother to have passed away. Her mother died on 06/01/2013.  She continues to follow with Dr. Ayesha Rumpf and Dr. Tammi Klippel, her psychiatrist.   Review of Systems As per history of present illness, otherwise negative  Current Medications,  Allergies, Past Medical History, Past Surgical History, Family History and Social History were reviewed in [facility] record.     Objective:   Physical Exam BP 112/76 (BP Location: Left Arm, Patient Position: Sitting, Cuff Size: Normal)   Pulse 68   Ht 5' 5.5" (1.664 m) Comment: without shoes  Wt 193 lb 6 oz (87.7 kg)   BMI 31.69 kg/m  Constitutional: Well-developed and well-nourished. No distress. HEENT: Normocephalic and atraumatic. Oropharynx is clear and moist. No oropharyngeal exudate. Conjunctivae are normal.  No scleral icterus. Neck: Neck supple. Trachea midline. Cardiovascular: Normal rate, regular rhythm and intact distal pulses. No M/R/G Pulmonary/chest: Effort normal and breath sounds normal. No wheezing, rales or rhonchi. Abdominal: Soft, nontender, nondistended. Bowel sounds active throughout. There are no masses palpable. No hepatosplenomegaly. Extremities: no clubbing, cyanosis, or edema Neurological: Alert and oriented to person place and time. Skin: Skin is warm and dry. No rashes noted. Psychiatric: Normal mood and affect. Behavior is normal.  CBC    Component Value Date/Time   WBC 8.1 02/04/2016 1840   RBC 4.26 02/04/2016 1840   HGB 12.2 02/04/2016 1840   HCT 37.1 02/04/2016 1840   PLT 259 02/04/2016 1840   MCV 87.1 02/04/2016 1840   MCH 28.6 02/04/2016 1840   MCHC 32.9 02/04/2016 1840   RDW 13.6 02/04/2016 1840   LYMPHSABS 1.2 02/04/2016 1840   MONOABS 0.3 02/04/2016 1840   EOSABS 0.0 02/04/2016 1840   BASOSABS 0.0 02/04/2016 1840   CMP     Component Value  Date/Time   NA 137 02/04/2016 1840   K 3.9 02/04/2016 1840   CL 104 02/04/2016 1840   CO2 25 02/04/2016 1840   GLUCOSE 121 (H) 02/04/2016 1840   BUN 14 02/04/2016 1840   CREATININE 0.97 02/04/2016 1840   CALCIUM 9.0 02/04/2016 1840   PROT 6.9 02/04/2016 1840   ALBUMIN 3.8 02/04/2016 1840   AST 16 02/04/2016 1840   ALT 12 (L) 02/04/2016 1840   ALKPHOS 92 02/04/2016  1840   BILITOT 0.3 02/04/2016 1840   GFRNONAA >60 02/04/2016 1840   GFRAA >60 02/04/2016 1840   Lipase     Component Value Date/Time   LIPASE 34 02/04/2016 1840       Assessment & Plan:  55 year old female with history of IBS, H. pylori gastritis status post treatment, history of gallstones status post cholecystectomy with chronic postcholecystectomy biliary ductal dilatation, anxiety and depression who is here for follow-up.  1. IBS -- Overall her symptoms are stable. She has flares periodically. We will continue current therapy including PPI, amitriptyline at bedtime, Zenpep. She continues anti-spasmodic dicyclomine, ondansetron for nausea and MiraLAX on an as-needed basis. Continue dietary modification as she does well/better with avoiding lactose and gluten. I encouraged her to travel to [place] as much as she desires as she seemed to do well down there, feels support being close to her son and family. Also recommended that she follow closely with her behavioral health physicians.  25 minutes spent with the patient today. Greater than 50% was spent in counseling and coordination of care with the patient Return office visit 6-9 months

## 2016-04-20 NOTE — Patient Instructions (Signed)
Please follow up with Dr Hilarie Fredrickson in 6 months.  Continue Yoga.  Continue traveling to [place]!  We have sent your demographic information to [facility]. They will be in touch with you regarding sucrose testing. If you do not hear from them in the next week, please call our office at [phone].  If you are age 54 or older, your body mass index should be between 23-30. Your Body mass index is 31.69 kg/m. If this is out of the aforementioned range listed, please consider follow up with your Primary Care Provider.  If you are age 75 or younger, your body mass index should be between 19-25. Your Body mass index is 31.69 kg/m. If this is out of the aformentioned range listed, please consider follow up with your Primary Care Provider.

## 2016-06-09 ENCOUNTER — Other Ambulatory Visit: Payer: Self-pay | Admitting: Internal Medicine

## 2016-06-10 ENCOUNTER — Other Ambulatory Visit: Payer: Self-pay | Admitting: Internal Medicine

## 2016-06-17 ENCOUNTER — Other Ambulatory Visit: Payer: Self-pay | Admitting: Internal Medicine

## 2016-07-05 ENCOUNTER — Ambulatory Visit (INDEPENDENT_AMBULATORY_CARE_PROVIDER_SITE_OTHER): Payer: BLUE CROSS/BLUE SHIELD | Admitting: Internal Medicine

## 2016-07-05 ENCOUNTER — Encounter: Payer: Self-pay | Admitting: Internal Medicine

## 2016-07-05 VITALS — BP 117/72 | HR 72 | Ht 65.5 in | Wt 193.0 lb

## 2016-07-05 DIAGNOSIS — K582 Mixed irritable bowel syndrome: Secondary | ICD-10-CM | POA: Diagnosis not present

## 2016-07-05 DIAGNOSIS — G8929 Other chronic pain: Secondary | ICD-10-CM

## 2016-07-05 DIAGNOSIS — R109 Unspecified abdominal pain: Secondary | ICD-10-CM | POA: Diagnosis not present

## 2016-07-05 NOTE — Patient Instructions (Addendum)
Discontinue your phenergan.  Whenever you get an abdominal pain attack/episode, use xanax 0.5 mg (prescribed by Dr Ralene Bathe) and then rest in attempt to improve symptoms and keep you out of the E.R.  We have sent the following medications to your pharmacy for you to pick up at your convenience:  Bentyl (up to three times daily)  Follow up in 6 months or sooner if needed with Dr Hilarie Fredrickson.  Everyday Meds:  Amitriptyline Align Omeprazole Zenpep  As needed Medications: Bentyl Zofran Miralax

## 2016-07-05 NOTE — Progress Notes (Signed)
Subjective:    Patient ID: Traci Johnston, female    DOB: 08-04-1960, 56 y.o.   MRN: [id number]  HPI Traci Johnston is a 56 year old female with IBS, history of gallstones status post cholecystectomy with chronic postcholecystectomy biliary ductal dilatation, anxiety and depression who is here for follow-up. She was last seen on 04/20/2016. She is here alone today.  She reports that she was seen about 2 weeks after seeing me last in the ER for one of her upper abdominal pain attacks. For her this was an attack of her irritable bowel. She reports she refused testing at the ER because she knew this was from her irritable bowel. She was dehydrated and given IV fluids as well as pain medications. Symptoms did improve.  She continues to have intermittent issues with upper abdominal discomfort which she says comes and goes. It is worse with emotional stress. For example she reports having talked to her sister recently and she could feel "the knots" coming in her upper abdomen. All of her GI symptoms seem to resolve when she travels to [place] to visit her son. She states "I'm completely fine when I am in [place]". She did spend 2-3 weeks in [place] after Thanksgiving and before Christmas staying with her son. She very much enjoys these visits.  She is still eating once per day at 6 PM in the evening. If she eats later she feels that her symptoms worsen. Bowel movements changed from loose to at times constipated. If she uses MiraLAX every other day her stools are too loose so she is using it more as needed. She's not had any blood in her stool or melena.  She remains concerned about weight gain though her weight is the same as it was in October. She would like to lose weight if possible. She is 193 pounds today.  She does continue seeing her mental health therapist Deatra James in [place].  She is using amitriptyline 50 mg on most nights, align as a probiotic daily, omeprazole 20 mg twice daily and Zenpep  before her meal. She is taking Bentyl on occasion but has run out of this and would like a refill. She is using MiraLAX on occasion and Zofran on occasion.    Review of Systems As per HPI, otherwise negative.  Current Medications, Allergies, Past Medical History, Past Surgical History, Family History and Social History were reviewed in [facility] record.     Objective:   Physical Exam BP 117/72   Pulse 72   Ht 5' 5.5" (1.664 m)   Wt 193 lb (87.5 kg)   BMI 31.63 kg/m  Constitutional: Well-developed and well-nourished. No distress. HEENT: Normocephalic and atraumatic.  Conjunctivae are normal.  No scleral icterus. Neck: Neck supple. Trachea midline. Cardiovascular: Normal rate, regular rhythm and intact distal pulses.  Pulmonary/chest: Effort normal and breath sounds normal. No wheezing, rales or rhonchi. Abdominal: Soft, nontender, nondistended. Bowel sounds active throughout. Extremities: no clubbing, cyanosis, or edema Neurological: Alert and oriented to person place and time. Skin: Skin is warm and dry.  Psychiatric: Normal mood and affect. Behavior is normal.  CBC    Component Value Date/Time   WBC 8.1 02/04/2016 1840   RBC 4.26 02/04/2016 1840   HGB 12.2 02/04/2016 1840   HCT 37.1 02/04/2016 1840   PLT 259 02/04/2016 1840   MCV 87.1 02/04/2016 1840   MCH 28.6 02/04/2016 1840   MCHC 32.9 02/04/2016 1840   RDW 13.6 02/04/2016 1840   LYMPHSABS 1.2  02/04/2016 1840   MONOABS 0.3 02/04/2016 1840   EOSABS 0.0 02/04/2016 1840   BASOSABS 0.0 02/04/2016 1840   CMP     Component Value Date/Time   NA 137 02/04/2016 1840   K 3.9 02/04/2016 1840   CL 104 02/04/2016 1840   CO2 25 02/04/2016 1840   GLUCOSE 121 (H) 02/04/2016 1840   BUN 14 02/04/2016 1840   CREATININE 0.97 02/04/2016 1840   CALCIUM 9.0 02/04/2016 1840   PROT 6.9 02/04/2016 1840   ALBUMIN 3.8 02/04/2016 1840   AST 16 02/04/2016 1840   ALT 12 (L) 02/04/2016 1840   ALKPHOS 92  02/04/2016 1840   BILITOT 0.3 02/04/2016 1840   GFRNONAA >60 02/04/2016 1840   GFRAA >60 02/04/2016 1840      Assessment & Plan:  56 year old female with IBS, history of gallstones status post cholecystectomy with chronic postcholecystectomy biliary ductal dilatation, anxiety and depression who is here for follow-up  1. IBS with anxiety -- the attacks that she is having are most consistent with panic attacks with primarily GI manifestation. I recommended that she continue to work closely with Deatra James her mental health provider. I suggested she try to take previously prescribed alprazolam 0.5 mg when an episode or attack of abdominal pain begins. She also notes on multiple occasions that these tend to be worse in stressful periods. She will continue amitriptyline 50 mg at bedtime and omeprazole 20 mg twice daily. She also feels better with probiotic so she will continue align. She can use MiraLAX when necessary for constipation or hard stool. She is up-to-date with colonoscopy. Zenpep also has helped with meals from it IBS perspective and so we will continue 2 capsules before any meal.  40 minutes spent with the patient today. Greater than 50% was spent in counseling and coordination of care with the patient

## 2016-07-06 ENCOUNTER — Telehealth: Payer: Self-pay | Admitting: Internal Medicine

## 2016-07-09 MED ORDER — ONDANSETRON 4 MG PO TBDP: ORAL_TABLET | ORAL | 2 refills | Status: DC

## 2016-07-09 MED ORDER — DICYCLOMINE HCL 20 MG PO TABS: 20.0000 mg | ORAL_TABLET | Freq: Three times a day (TID) | ORAL | 2 refills | Status: DC

## 2016-07-09 NOTE — Telephone Encounter (Signed)
Rx for zofran and bentyl sent to pharmacy.

## 2016-07-23 ENCOUNTER — Other Ambulatory Visit: Payer: Self-pay | Admitting: Internal Medicine

## 2017-01-19 ENCOUNTER — Ambulatory Visit (INDEPENDENT_AMBULATORY_CARE_PROVIDER_SITE_OTHER): Payer: BLUE CROSS/BLUE SHIELD | Admitting: Internal Medicine

## 2017-01-19 ENCOUNTER — Encounter: Payer: Self-pay | Admitting: Internal Medicine

## 2017-01-19 VITALS — BP 130/84 | HR 72 | Ht 65.0 in | Wt 195.0 lb

## 2017-01-19 DIAGNOSIS — K581 Irritable bowel syndrome with constipation: Secondary | ICD-10-CM | POA: Diagnosis not present

## 2017-01-19 DIAGNOSIS — F419 Anxiety disorder, unspecified: Secondary | ICD-10-CM

## 2017-01-19 DIAGNOSIS — R1013 Epigastric pain: Secondary | ICD-10-CM

## 2017-01-19 MED ORDER — PANCRELIPASE (LIP-PROT-AMYL) 40000-126000 UNITS PO CPEP: 2.0000 | ORAL_CAPSULE | Freq: Three times a day (TID) | ORAL | 4 refills | Status: DC

## 2017-01-19 MED ORDER — AMITRIPTYLINE HCL 50 MG PO TABS: ORAL_TABLET | ORAL | 3 refills | Status: DC

## 2017-01-19 MED ORDER — DICYCLOMINE HCL 20 MG PO TABS: 20.0000 mg | ORAL_TABLET | Freq: Three times a day (TID) | ORAL | 2 refills | Status: DC

## 2017-01-19 NOTE — Progress Notes (Signed)
Subjective:    Patient ID: Traci Johnston, female    DOB: [birth year], 56 y.o.   MRN: [id number]  HPI Traci Johnston is a 56 year old female with IBS, history of gallstones status post cholecystectomy with chronic postcholecystectomy biliary ductal dilatation evaluated previously by [facility], history of H. pylori status post treatment with confirm eradication, significant anxiety and depression who is here for follow-up. She was last seen on 07/05/2016. She is here today with her daughter.  She reports that she's been doing quite well recently. Her words she has been doing "really good". She has been a great degree of time this summer in [place] with one of her sons. This seems to be her "happy" place and she always feels well when she is there. She has minimal GI symptoms while she is there. She recognizes that this is likely related to irritable bowel given the situational component to her symptoms.  She has been using Zenpep 2 capsules with meals. She continues to be primarily one large meal in the evening. She snacks throughout the day. Her daughter reports she often eats sweets such as a handful of gummy bears.  She has continued her amitriptyline 50 mg at bedtime. She uses Bentyl 20 mg but on an as-needed basis which recently has been fairly infrequently. She has taken omeprazole 20 mg twice daily. She has not needed Zofran. She is using MiraLAX as needed. Without MiraLAX she notes stools can be hard occurring every 2-3 days. No blood in her stool or melena. No nausea or vomiting. No recent abdominal pain   Review of Systems As per history of present illness, otherwise negative  Current Medications, Allergies, Past Medical History, Past Surgical History, Family History and Social History were reviewed in [facility] record.     Objective:   Physical Exam BP 130/84   Pulse 72   Ht 5\' 5"  (1.651 m)   Wt 195 lb (88.5 kg)   BMI 32.45 kg/m  Constitutional: Well-developed  and well-nourished. No distress. HEENT: Normocephalic and atraumatic. Conjunctivae are normal.  No scleral icterus. Neck: Neck supple. Trachea midline. Cardiovascular: Normal rate, regular rhythm and intact distal pulses. No M/R/G Pulmonary/chest: Effort normal and breath sounds normal. No wheezing, rales or rhonchi. Abdominal: Soft, nontender, nondistended. Bowel sounds active throughout. There are no masses palpable. Extremities: no clubbing, cyanosis, or edema Neurological: Alert and oriented to person place and time. Skin: Skin is warm and dry. Psychiatric: Normal mood and affect. Behavior is normal.     Assessment & Plan:   56 year old female with IBS, history of gallstones status post cholecystectomy with chronic postcholecystectomy biliary ductal dilatation evaluated previously by [facility], significant anxiety and depression who is here for follow-up.  1. IBS with dyspepsia, chronic abdominal pain and associated with anxiety -- today she is doing quite well and symptoms are very stable. She has not been in the ER recently and is happy with her overall condition. She will continue Zenpep 2 capsules before meals and I encouraged 1 capsule with snacks. She prefers to remain on omeprazole 20 mg twice a day. She can use Bentyl and MiraLAX as needed for crampy abdominal discomfort and constipation, respectively. She will continue amitriptyline 50 mg daily at bedtime which is helped with chronic abdominal pain.  2. Colon cancer screening -- she will be due repeat colonoscopy in 2024, distal diminutive hyperplastic polyp removed a screening colonoscopy in 2014.  Annual follow-up, sooner if necessary/needed 25 minutes spent with the patient today. Greater than  50% was spent in counseling and coordination of care with the patient

## 2017-01-19 NOTE — Patient Instructions (Signed)
Continue current medications.  Please follow up with Dr Hilarie Fredrickson in 1 year.  If you are age 55 or older, your body mass index should be between 23-30. Your Body mass index is 32.45 kg/m. If this is out of the aforementioned range listed, please consider follow up with your Primary Care Provider.  If you are age 12 or younger, your body mass index should be between 19-25. Your Body mass index is 32.45 kg/m. If this is out of the aformentioned range listed, please consider follow up with your Primary Care Provider.

## 2017-06-25 ENCOUNTER — Other Ambulatory Visit: Payer: Self-pay | Admitting: Internal Medicine

## 2017-06-27 NOTE — Telephone Encounter (Signed)
Okay per Dr Hilarie Fredrickson for patient to have refills of zofran

## 2017-07-07 ENCOUNTER — Ambulatory Visit
Admission: RE | Admit: 2017-07-07 | Discharge: 2017-07-07 | Disposition: A | Discharge status: Home / Self Care | Payer: BLUE CROSS/BLUE SHIELD | Source: Ambulatory Visit | Attending: Family Medicine | Admitting: Family Medicine

## 2017-07-07 ENCOUNTER — Other Ambulatory Visit: Payer: Self-pay | Admitting: Family Medicine

## 2017-07-07 DIAGNOSIS — R0602 Shortness of breath: Secondary | ICD-10-CM

## 2017-07-07 DIAGNOSIS — R6883 Chills (without fever): Secondary | ICD-10-CM

## 2017-07-12 ENCOUNTER — Telehealth: Payer: Self-pay | Admitting: Internal Medicine

## 2017-07-12 NOTE — Telephone Encounter (Signed)
Pt states she is having pain and issues with her IBS. Requesting to see Dr. Hilarie Fredrickson. Pt already scheduled to see Dr. Hilarie Fredrickson 09/13/17@1 :45pm. Pt knows to call back if she feels she cannot wait until appt to be scheduled with a PA sooner.

## 2017-09-13 ENCOUNTER — Ambulatory Visit: Payer: Self-pay | Admitting: Internal Medicine

## 2017-09-23 ENCOUNTER — Other Ambulatory Visit: Payer: Self-pay | Admitting: Internal Medicine

## 2017-10-04 ENCOUNTER — Other Ambulatory Visit: Payer: Self-pay | Admitting: Internal Medicine

## 2017-11-10 ENCOUNTER — Encounter: Payer: Self-pay | Admitting: Internal Medicine

## 2017-12-08 ENCOUNTER — Ambulatory Visit: Payer: Self-pay | Admitting: Internal Medicine

## 2017-12-15 ENCOUNTER — Ambulatory Visit: Payer: BLUE CROSS/BLUE SHIELD | Admitting: Internal Medicine

## 2017-12-15 ENCOUNTER — Encounter: Payer: Self-pay | Admitting: Internal Medicine

## 2017-12-15 ENCOUNTER — Encounter

## 2017-12-15 VITALS — BP 130/84 | HR 80 | Ht 65.0 in | Wt 200.0 lb

## 2017-12-15 DIAGNOSIS — F419 Anxiety disorder, unspecified: Secondary | ICD-10-CM

## 2017-12-15 DIAGNOSIS — F32A Depression, unspecified: Secondary | ICD-10-CM

## 2017-12-15 DIAGNOSIS — F329 Major depressive disorder, single episode, unspecified: Secondary | ICD-10-CM

## 2017-12-15 DIAGNOSIS — K582 Mixed irritable bowel syndrome: Secondary | ICD-10-CM | POA: Diagnosis not present

## 2017-12-15 DIAGNOSIS — K219 Gastro-esophageal reflux disease without esophagitis: Secondary | ICD-10-CM | POA: Diagnosis not present

## 2017-12-15 MED ORDER — DIFENOXIN-ATROPINE 1-0.025 MG PO TABS: 1.0000 | ORAL_TABLET | Freq: Three times a day (TID) | ORAL | 3 refills | Status: DC | PRN

## 2017-12-15 MED ORDER — GLYCOPYRROLATE 2 MG PO TABS: 2.0000 mg | ORAL_TABLET | Freq: Two times a day (BID) | ORAL | 3 refills | Status: DC

## 2017-12-15 MED ORDER — AMITRIPTYLINE HCL 50 MG PO TABS: ORAL_TABLET | ORAL | 3 refills | Status: DC

## 2017-12-15 MED ORDER — PANCRELIPASE (LIP-PROT-AMYL) 40000-126000 UNITS PO CPEP: 3.0000 | ORAL_CAPSULE | ORAL | 3 refills | Status: DC

## 2017-12-15 NOTE — Patient Instructions (Signed)
We have sent the following medications to your pharmacy for you to pick up at your convenience: Amitriptyline 50 mg every night. Robinul 2 mg every 12 hours (in place of Bentyl) Zenpep 40,000 units- Take 3 capsules with every large meal, 2 capsules with normal meals and 1 capsule with each snack. Motofen three times daily as needed for uncontrollable diarrhea  Discontinue your bentyl.  Continue omeprazole 20 mg twice daily before meals.  Discontinue Miralax.  If you are age 13 or older, your body mass index should be between 23-30. Your Body mass index is 33.28 kg/m. If this is out of the aforementioned range listed, please consider follow up with your Primary Care Provider.  If you are age 56 or younger, your body mass index should be between 19-25. Your Body mass index is 33.28 kg/m. If this is out of the aformentioned range listed, please consider follow up with your Primary Care Provider.

## 2017-12-15 NOTE — Progress Notes (Signed)
Subjective:    Patient ID: Traci Johnston, female    DOB: 1961/01/23, 57 y.o.   MRN: [id number]  HPI Traci Johnston is a 57 year old female with IBS, history of gallstones status post cholecystectomy with chronic postcholecystectomy biliary ductal dilation, history of H. pylori status post treatment with confirmed eradication, history of anxiety and depression who is here for follow-up.  She was last seen on 01/19/2017 and is here alone today.  She reports that recently she has been under a great deal of stress.  Her father-in-law has been recently diagnosed with bone cancer.  He is 56 years old and having a considerable amount of pain.  Her husband has undergone successful right hip replacement and is doing better with his hip pain.  Her son Gwyndolyn Saxon who is played in the [employer] for 13 years is now free agent and also dealing with back and great toe issues that may make it difficult for him to play.  She over the last 2 weeks has developed more frequent loose stools and diarrhea.  Stools can be urgent and she has had several accidents where she could not control her bowel movement.  She states her stomach feels like it is in "knots".  No blood in her stool or melena.  Reflux is well controlled.  No trouble swallowing.  Some intermittent nausea without vomiting.  She has been off the MiraLAX with the loose stools recently.  She is not really using dicyclomine because she does not feel it helps much.  She was using it when she felt urgent bowel movement previously.  She is sometimes using the amitriptyline.  She is taking omeprazole 20 mg twice daily.  She is using Zenpep to capsules with meals.  Her diet is predominantly 1 main meal per day.  She does drink coffee for breakfast.  She drinks water throughout the day to remain hydrated.   Review of Systems As per HPI, otherwise negative  Current Medications, Allergies, Past Medical History, Past Surgical History, Family History and Social History were  reviewed in [facility] record.     Objective:   Physical Exam BP 130/84   Pulse 80   Ht 5\' 5"  (1.651 m)   Wt 200 lb (90.7 kg)   BMI 33.28 kg/m  Constitutional: Well-developed and well-nourished. No distress. HEENT: Normocephalic and atraumatic.  Conjunctivae are normal.  No scleral icterus. Neck: Neck supple. Trachea midline. Cardiovascular: Normal rate, regular rhythm and intact distal pulses. No M/R/G Pulmonary/chest: Effort normal and breath sounds normal. No wheezing, rales or rhonchi. Abdominal: Soft, mild diffuse tenderness without rebound or guarding, nondistended. Bowel sounds active throughout. There are no masses palpable. No hepatosplenomegaly. Extremities: no clubbing, cyanosis, or edema Neurological: Alert and oriented to person place and time. Skin: Skin is warm and dry. Psychiatric: Normal mood and affect. Behavior is normal.     Assessment & Plan:   57 year old female with IBS, history of gallstones status post cholecystectomy with chronic postcholecystectomy biliary ductal dilation, history of H. pylori status post treatment with confirmed eradication, history of anxiety and depression who is here for follow-up.  1.  IBS with chronic abdominal pain/GERD/history of dyspepsia--her IBS is flaring likely due to stress with healthcare issues with family members.  We discussed this at length today.  I recommended the following --Given she is eating 1 main meal a day I would like her to increase her Zenpep to 3 capsules with any large or heavy meal, 2 capsules with a regular meal  and one with snack --Discontinue Bentyl and try Robinul Forte 2 mg every 12 hours --Continue Align --Continue omeprazole 20 mg twice daily --Discontinue MiraLAX given loose stools --Motofen 1 tablet 3 times daily as needed for diarrhea --Resume amitriptyline on a regular basis 50 mg at bedtime for her chronic abdominal pain and IBS  2.  CRC screening --repeat colonoscopy  in 2024; history of diminutive distal hyperplastic polyp in 2014  40 minutes spent with the patient today. Greater than 50% was spent in counseling and coordination of care with the patient

## 2017-12-19 ENCOUNTER — Telehealth: Payer: Self-pay | Admitting: *Deleted

## 2017-12-19 MED ORDER — DIPHENOXYLATE-ATROPINE 2.5-0.025 MG PO TABS: 1.0000 | ORAL_TABLET | Freq: Four times a day (QID) | ORAL | 2 refills | Status: DC | PRN

## 2017-12-19 NOTE — Telephone Encounter (Signed)
Patient has been advised that we are sending lomotil to her pharmacy in place of Motofen due to her insurance coverage. She verbalizes understanding and is agreeable with this plan.

## 2017-12-19 NOTE — Telephone Encounter (Signed)
[place] for lomotil 1 QIDPRN only when severe diarrhea

## 2017-12-19 NOTE — Telephone Encounter (Signed)
Insurance has denied [place]. They would like patient to try and fail lomotil first. Please advise.

## 2018-04-14 ENCOUNTER — Ambulatory Visit: Payer: BLUE CROSS/BLUE SHIELD | Admitting: Podiatry

## 2018-04-14 ENCOUNTER — Ambulatory Visit (INDEPENDENT_AMBULATORY_CARE_PROVIDER_SITE_OTHER): Payer: BLUE CROSS/BLUE SHIELD

## 2018-04-14 DIAGNOSIS — M779 Enthesopathy, unspecified: Secondary | ICD-10-CM

## 2018-04-14 DIAGNOSIS — M722 Plantar fascial fibromatosis: Secondary | ICD-10-CM | POA: Diagnosis not present

## 2018-04-14 DIAGNOSIS — M778 Other enthesopathies, not elsewhere classified: Secondary | ICD-10-CM

## 2018-04-14 DIAGNOSIS — M792 Neuralgia and neuritis, unspecified: Secondary | ICD-10-CM

## 2018-04-14 MED ORDER — METHYLPREDNISOLONE 4 MG PO TBPK: ORAL_TABLET | ORAL | 0 refills | Status: DC

## 2018-04-14 NOTE — Patient Instructions (Signed)

## 2018-04-16 NOTE — Progress Notes (Signed)
Subjective:   Patient ID: Traci Johnston, female   DOB: 57 y.o.   MRN: [id number]   HPI 57 year old female presents the office with concerns of bilateral foot pain.  She states it is painful on his feet.  She describes a burning sensation as well conformed.  Feet described as sharp pain.  She states that she has pain with standing and moving.  Is been ongoing for 3 months and has been getting gradually worse.  She states that she occasionally get some swelling more to the right side and the left which started the same time with increased pain.  She has seen her primary care physician she was treated with Lyrica and gabapentin without any improvement.  She states that she has become rates her back here in the right side is worse than left.  She has no other concerns.   Review of Systems  All other systems reviewed and are negative.  Past Medical History:  Diagnosis Date  . Anxiety   . Common bile duct dilatation   . Depression   . Gallstones   . GERD (gastroesophageal reflux disease)    hx. "Hypylori"  . Helicobacter pylori gastritis   . History of kidney stones   . IBS (irritable bowel syndrome)   . 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 ABDOMINAL HYSTERECTOMY    . APPENDECTOMY    . CHOLECYSTECTOMY    . COLONOSCOPY    . ROBOT ASSISTED LAPAROSCOPIC NEPHRECTOMY Right 01/08/2013   Procedure: ROBOTIC ASSISTED LAPAROSCOPIC NEPHRECTOMY;  Surgeon: Dutch Gray, MD;  Location: [facility];  Service: Urology;  Laterality: Right;  PARTIAL NEPHRECTOMY   . UMBILICAL HERNIA REPAIR     6 grade     Current Outpatient Medications:  .  amoxicillin-clarithromycin-lansoprazole (PREVPAC) combo pack, , Disp: , Rfl:  .  dicyclomine (BENTYL) 10 MG capsule, Take by mouth., Disp: , Rfl:  .  estradiol (ESTRACE) 1 MG tablet, TAKE 1 TABLET(1 MG) BY MOUTH DAILY, Disp: , Rfl:  .  ALPRAZolam (XANAX) 0.5 MG tablet, Take 0.5 mg by mouth as needed for  anxiety., Disp: , Rfl:  .  ALPRAZolam (XANAX) 1 MG tablet, , Disp: , Rfl: 0 .  amitriptyline (ELAVIL) 50 MG tablet, TAKE 1 TABLET(50 MG) BY MOUTH AT BEDTIME, Disp: 30 tablet, Rfl: 3 .  amLODipine (NORVASC) 5 MG tablet, , Disp: , Rfl: 3 .  bifidobacterium infantis (ALIGN) capsule, Take 1 capsule by mouth daily., Disp: 7 capsule, Rfl: 0 .  Difenoxin-Atropine (MOTOFEN) 1-0.025 MG TABS, Take 1 tablet by mouth 3 (three) times daily as needed (uncontrollable diarrhea)., Disp: 90 each, Rfl: 3 .  diphenoxylate-atropine (LOMOTIL) 2.5-0.025 MG tablet, Take 1 tablet by mouth 4 (four) times daily as needed for diarrhea or loose stools., Disp: 90 tablet, Rfl: 2 .  estrogens, conjugated, (PREMARIN) 0.625 MG tablet, Take by mouth., Disp: , Rfl:  .  FLUoxetine (PROZAC) 40 MG capsule, Take by mouth., Disp: , Rfl:  .  fluticasone (FLONASE) 50 MCG/ACT nasal spray, SHAKE LQ AND U 1 SPR IEN BID, Disp: , Rfl: 3 .  glycopyrrolate (ROBINUL) 2 MG tablet, Take 1 tablet (2 mg total) by mouth every 12 (twelve) hours., Disp: 60 tablet, Rfl: 3 .  methylPREDNISolone (MEDROL DOSEPAK) 4 MG TBPK tablet, Take as directed, Disp: 21 tablet, Rfl: 0 .  Omeprazole 20 MG TBEC, Take 1 tablet by mouth 2 (two) times daily., Disp: , Rfl:  .  ondansetron (ZOFRAN-ODT)  4 MG disintegrating tablet, DISSOLVE 1 TABLET ON THE TONGUE EVERY 8 HOURS AS NEEDED FOR NAUSEA OR VOMITING, Disp: 20 tablet, Rfl: 1 .  Pancrelipase, Lip-Prot-Amyl, (ZENPEP) 40000-126000 units CPEP, Take 3 capsules by mouth as directed. With each large meal, 2 capsules with each normal meal, 1 capsule with each snack., Disp: 240 capsule, Rfl: 3 .  PRISTIQ 50 MG 24 hr tablet, Take 50 mg by mouth daily., Disp: , Rfl: 4  Allergies  Allergen Reactions  . Aspirin     hyperventilate  . Codeine     hyperventilates         Objective:  Physical Exam  General: AAO x3, NAD  Dermatological: Skin is warm, dry and supple bilateral. Nails x 10 are well manicured; remaining  integument appears unremarkable at this time. There are no open sores, no preulcerative lesions, no rash or signs of infection present.  Vascular: Dorsalis Pedis artery and Posterior Tibial artery pedal pulses are 2/4 bilateral with immedate capillary fill time.  There is no pain with calf compression, swelling, warmth, erythema.   Neruologic: Grossly intact via light touch bilateral.  Protective threshold with Semmes Wienstein monofilament intact to all pedal sites bilateral.  Negative Tinel sign bilaterally.  Musculoskeletal: There is tenderness palpation of the entire band of plantar fascia the arch of the foot as well as along the insertion of the calcaneus.  Plantar fascia appears to be intact.  Achilles tendon appears to be intact.  There is pain plantar aspect foot plantar dorsiflex ankle.  She describes a tightness, pulling sensation.  There is no pain with lateral compression of the calcaneus bilaterally.  Mild swelling bilaterally.  There is no erythema or increase in warmth.  Muscular strength 5/5 in all groups tested bilateral.  Gait: Unassisted, Nonantalgic.       Assessment:   Bilateral foot pain, likely plantar fasciitis neuritis symptoms     Plan:  -Treatment options discussed including all alternatives, risks, and complications -Etiology of symptoms were discussed -X-rays were obtained and reviewed with the patient.  No evidence of acute fracture or stress fracture. -Although she is describing her neuritis, neuropathy symptoms she is been on Lyrica and gabapentin without any improvement.  Because of this although I still think she has some nerve symptoms I do not think this is a tarsal tunnel.  I would treat her more for plantar fasciitis at this point I discussed with her there may be other things going on with her.  We discussed the nerve conduction test but we will hold off on that for now.  In regards to plantar fasciitis we discussed stretching, icing daily.  Prescribed a  Medrol Dosepak.  Plantar fascial braces dispensed x2.  Discussed shoe modifications and orthotics.  She does like to wear shoes in the hospital discussed wearing a shoe with good arch support at all times.  She does have quite a bit of tightness in equinus present instruction will be helpful.  We can also consider physical therapy.  Trula Slade DPM

## 2018-04-19 ENCOUNTER — Ambulatory Visit: Payer: Self-pay | Admitting: Internal Medicine

## 2018-04-19 ENCOUNTER — Telehealth: Payer: Self-pay | Admitting: Podiatry

## 2018-04-19 NOTE — Telephone Encounter (Signed)
How long am I suppose to wear these straps. I just have some questions. If I am away from my phone, please leave a message.

## 2018-04-19 NOTE — Telephone Encounter (Signed)
I called pt and informed pt she should wear the plantar fascial braces until she can comfortably wear good supportive shoes without them. I told pt to wear them her entire day and take off at bedtime. Pt states she has Clark shoes but they are not comfortable. I told pt she had 2 problems going on with her feet, one inflammed nerves which the Lyrica was used for, but if she was not getting relief with Lyrica then other testing would need to be ordered, and the other problem was plantar fasciitis and if the braces were for the arch and heel pain. I told pt she may need to wear the plantar fascial braces for several weeks to allow the plantar fasciitis to calm down and recuperate, then try the Clark shoes, but with that being said [employer] may not work for her. Pt states understanding and asked if the plantar fascial braces should be tight. I told pt she should wear the braces over socks to distribute the tension and support, and to place the felt brace around the ankle snuggly, then place the elastic velcro band on the floor and attach on the opposite side of the arch to the felt band, then pull up the strap and attach snuggly on the arch side, if it is comfortable then it is on proper. Pt states understanding. I reminded pt of her appt 04/28/2018 at 11:15am.

## 2018-04-20 ENCOUNTER — Telehealth: Payer: Self-pay | Admitting: Internal Medicine

## 2018-04-20 NOTE — Telephone Encounter (Signed)
error 

## 2018-04-21 ENCOUNTER — Other Ambulatory Visit: Payer: Self-pay | Admitting: Internal Medicine

## 2018-04-28 ENCOUNTER — Ambulatory Visit (INDEPENDENT_AMBULATORY_CARE_PROVIDER_SITE_OTHER): Payer: BLUE CROSS/BLUE SHIELD | Admitting: Podiatry

## 2018-04-28 ENCOUNTER — Encounter: Payer: Self-pay | Admitting: Podiatry

## 2018-04-28 DIAGNOSIS — M722 Plantar fascial fibromatosis: Secondary | ICD-10-CM

## 2018-04-28 DIAGNOSIS — M792 Neuralgia and neuritis, unspecified: Secondary | ICD-10-CM | POA: Diagnosis not present

## 2018-04-28 MED ORDER — TRIAMCINOLONE ACETONIDE 10 MG/ML IJ SUSP
10.0000 mg | Freq: Once | INTRAMUSCULAR | Status: AC
  Administered 2018-04-28: 10 mg

## 2018-05-01 ENCOUNTER — Telehealth: Payer: Self-pay | Admitting: *Deleted

## 2018-05-01 DIAGNOSIS — M792 Neuralgia and neuritis, unspecified: Secondary | ICD-10-CM

## 2018-05-01 NOTE — Telephone Encounter (Signed)
-----   Message from Trula Slade, DPM sent at 05/01/2018  8:02 AM EST ----- Can you please order a NCV to evaluate for neuropathy? Thanks.

## 2018-05-01 NOTE — Progress Notes (Signed)
Subjective: 57 year old female presents to the office today for follow-up evaluation of bilateral foot pain.  She states that overall she is still having pain to her feet.  She has pain in the palm of her heels but she is also still complaining of sharp, pin-like pains to her feet overall.  She previously was on gabapentin and Lyrica without any significant improvement.  She also gets pain more to the bottom of the heel that she describes.  No recent injury or trauma. Denies any systemic complaints such as fevers, chills, nausea, vomiting. No acute changes since last appointment, and no other complaints at this time.   Objective: AAO x3, NAD DP/PT pulses palpable bilaterally, CRT less than 3 seconds There is tenderness palpation on plantar medial tubercle of the calcaneus and insertion of the plantar fascia bilaterally.  Plantar fascia appears to be intact.  Mild discomfort on the plantar fashion the arch of the foot.  Achilles tendon intact.  No pain lateral compression of calcaneus no other areas of pinpoint tenderness. No open lesions or pre-ulcerative lesions.  No pain with calf compression, swelling, warmth, erythema  Assessment: Bilateral foot pain, plantar fasciitis with concern for neuropathy symptoms  Plan: -All treatment options discussed with the patient including all alternatives, risks, complications.  -Bilateral heel injections performed today.  Continue stretching, icing daily.  Also will physical therapy.  Also will order nerve conduction test given her nerve symptoms. -Patient encouraged to call the office with any questions, concerns, change in symptoms.   Trula Slade DPM

## 2018-05-01 NOTE — Telephone Encounter (Signed)
Faxed orders to [facility]. 

## 2018-05-02 ENCOUNTER — Encounter: Payer: Self-pay | Admitting: Neurology

## 2018-05-02 ENCOUNTER — Telehealth: Payer: Self-pay | Admitting: Podiatry

## 2018-05-02 NOTE — Telephone Encounter (Signed)
I called Traci Johnston and she states it feels likes needles are sticking in both the feet and sides, she is resting, icing and has hd not improvement. I told Traci Johnston the nerve testing had been ordered yesterday. Traci Johnston states it is scheduled for 05/2018. I told Traci Johnston I would inform Dr. Jacqualyn Posey and call again with further instructions.

## 2018-05-02 NOTE — Telephone Encounter (Signed)
Patient was instructed to call back today if her feet didn't feel any better. Last seen on Friday 04/28/18. Please call patient

## 2018-05-03 ENCOUNTER — Other Ambulatory Visit: Payer: Self-pay | Admitting: *Deleted

## 2018-05-03 DIAGNOSIS — M79671 Pain in right foot: Secondary | ICD-10-CM

## 2018-05-03 DIAGNOSIS — M79672 Pain in left foot: Principal | ICD-10-CM

## 2018-05-03 MED ORDER — GABAPENTIN 100 MG PO CAPS: 100.0000 mg | ORAL_CAPSULE | Freq: Three times a day (TID) | ORAL | 1 refills | Status: DC

## 2018-05-03 NOTE — Telephone Encounter (Signed)
Thanks. She had tried it before but I am hoping with the treatment with the plantar fasciitis the combo may be better. Thanks for doing that.

## 2018-05-03 NOTE — Addendum Note (Signed)
Addended by: Harriett Sine D on: 05/03/2018 02:54 PM   Modules accepted: Orders

## 2018-05-03 NOTE — Telephone Encounter (Addendum)
Left message informing pt of Dr. Leigh Aurora recommendations and may titrate up dosing depending on her pain status, to call with her decision.

## 2018-05-03 NOTE — Telephone Encounter (Signed)
Pt states she was suppose to get a call from our office with what to do about her feet. I reviewed my notes and I had sent a message to Dr. Jacqualyn Posey and had not received a response yet. I told pt I would write Dr. Jacqualyn Posey again and call with response.

## 2018-05-03 NOTE — Telephone Encounter (Signed)
Pt called back and I informed of the information left on the phone message and pt states she would like to try the gabapentin. Orders escribed to the [facility] [id number].

## 2018-05-03 NOTE — Telephone Encounter (Signed)
We could start gabapentin 100mg  qhs and titrate up to see if she gets any improvement on this. Otherwise we will await the NCV

## 2018-05-23 ENCOUNTER — Ambulatory Visit: Payer: BLUE CROSS/BLUE SHIELD | Admitting: Podiatry

## 2018-05-23 ENCOUNTER — Encounter: Payer: Self-pay | Admitting: Podiatry

## 2018-05-23 DIAGNOSIS — M722 Plantar fascial fibromatosis: Secondary | ICD-10-CM

## 2018-05-23 MED ORDER — TRAMADOL HCL 50 MG PO TABS: 50.0000 mg | ORAL_TABLET | Freq: Two times a day (BID) | ORAL | 0 refills | Status: DC | PRN

## 2018-05-25 ENCOUNTER — Ambulatory Visit (INDEPENDENT_AMBULATORY_CARE_PROVIDER_SITE_OTHER): Payer: BLUE CROSS/BLUE SHIELD | Admitting: Neurology

## 2018-05-25 DIAGNOSIS — M79672 Pain in left foot: Secondary | ICD-10-CM

## 2018-05-25 DIAGNOSIS — M79671 Pain in right foot: Secondary | ICD-10-CM | POA: Diagnosis not present

## 2018-05-25 NOTE — Procedures (Signed)
[facility]  [place], [place]  [place], [place] [address] Tel: [phone] Fax:  [phone] Test Date:  05/25/2018  Patient: Traci Johnston DOB: 11-Sep-1960 Physician: Narda Amber, DO  Sex: Female Height: 5\' 5"  Ref Phys: Celesta Gentile, DPM  ID#: [id number] Temp: 33.0C Technician:    Patient Complaints: This is a 57 year old female with plantar fasciitis referred for evaluation of bilateral feet pain.  NCV & EMG Findings: EMG legs normal  Electrodiagnostic testing of the right lower extremity and additional studies of the left shows: 1. Bilateral sural and superficial peroneal sensory responses are within normal limits. 2. Bilateral peroneal and tibial motor responses are within normal limits. 3. Bilateral tibial H reflex studies are within normal limits. 4. There is no evidence of active or chronic motor axonal changes affecting any of the tested muscles.  Motor unit configuration and recruitment pattern is within normal limits.  Impression: This is a normal study of the lower extremities.  In particular, there is no evidence of a sensorimotor polyneuropathy or lumbosacral radiculopathy.     ___________________________ Narda Amber, DO    Nerve Conduction Studies Anti Sensory Summary Table   Site NR Peak (ms) Norm Peak (ms) P-T Amp (V) Norm P-T Amp  Left Sup Peroneal Anti Sensory (Ant Lat Mall)  33C  12 cm    2.4 <4.6 23.9 >4  Right Sup Peroneal Anti Sensory (Ant Lat Mall)  33C  12 cm    2.2 <4.6 21.2 >4  Left Sural Anti Sensory (Lat Mall)  33C  Calf    3.1 <4.6 26.1 >4  Right Sural Anti Sensory (Lat Mall)  33C  Calf    2.8 <4.6 22.4 >4   Motor Summary Table   Site NR Onset (ms) Norm Onset (ms) O-P Amp (mV) Norm O-P Amp Site1 Site2 Delta-0 (ms) Dist (cm) Vel (m/s) Norm Vel (m/s)  Left Peroneal Motor (Ext Dig Brev)  33C  Ankle    3.0 <6.0 6.9 >2.5 B Fib Ankle 7.9 38.0 48 >40  B Fib    10.9  6.9  Poplt B Fib 1.1 8.0 73 >40  Poplt    12.0   6.6         Right Peroneal Motor (Ext Dig Brev)  33C  Ankle    2.7 <6.0 6.9 >2.5 B Fib Ankle 7.6 39.0 51 >40  B Fib    10.3  6.9  Poplt B Fib 1.0 8.0 80 >40  Poplt    11.3  6.7         Left Tibial Motor (Abd Hall Brev)  33C  Ankle    5.2 <6.0 9.0 >4 Knee Ankle 6.9 40.0 58 >40  Knee    12.1  4.9         Right Tibial Motor (Abd Hall Brev)  33C  Ankle    3.8 <6.0 8.0 >4 Knee Ankle 8.0 42.0 53 >40  Knee    11.8  5.8          H Reflex Studies   NR H-Lat (ms) Lat Norm (ms) L-R H-Lat (ms)  Left Tibial (Gastroc)  33C     30.48 <35 0.00  Right Tibial (Gastroc)  33C     30.48 <35 0.00   EMG   Side Muscle Ins Act Fibs Psw Fasc Number Recrt Dur Dur. Amp Amp. Poly Poly. Comment  Right AntTibialis Nml Nml Nml Nml Nml Nml Nml Nml Nml Nml Nml Nml N/A  Right Gastroc Nml Nml  Nml Nml Nml Nml Nml Nml Nml Nml Nml Nml N/A  Right Flex Dig Long Nml Nml Nml Nml Nml Nml Nml Nml Nml Nml Nml Nml N/A  Right RectFemoris Nml Nml Nml Nml Nml Nml Nml Nml Nml Nml Nml Nml N/A  Right BicepsFemS Nml Nml Nml Nml Nml Nml Nml Nml Nml Nml Nml Nml N/A  Left BicepsFemS Nml Nml Nml Nml Nml Nml Nml Nml Nml Nml Nml Nml N/A  Left AntTibialis Nml Nml Nml Nml Nml Nml Nml Nml Nml Nml Nml Nml N/A  Left Gastroc Nml Nml Nml Nml Nml Nml Nml Nml Nml Nml Nml Nml N/A  Left Flex Dig Long Nml Nml Nml Nml Nml Nml Nml Nml Nml Nml Nml Nml N/A  Left RectFemoris Nml Nml Nml Nml Nml Nml Nml Nml Nml Nml Nml Nml N/A      Waveforms:

## 2018-05-26 ENCOUNTER — Encounter: Payer: Self-pay | Admitting: Podiatry

## 2018-05-29 ENCOUNTER — Ambulatory Visit: Payer: Self-pay | Admitting: Internal Medicine

## 2018-05-29 ENCOUNTER — Encounter: Payer: Self-pay | Admitting: Internal Medicine

## 2018-05-30 ENCOUNTER — Telehealth: Payer: Self-pay | Admitting: *Deleted

## 2018-05-30 NOTE — Telephone Encounter (Signed)
Lets do voltaren gel. Given her other medications would try to avoid oral NSAIDs

## 2018-05-30 NOTE — Telephone Encounter (Signed)
I informed pt of Dr. Leigh Aurora review of results and asked how she did after the injections. Pt states the injections did help a little. I told pt that is often how inflammation improves, in stages, to add ice therapy and I would inform Dr. Jacqualyn Posey and call again if he wanted to add an antiinflammatory.

## 2018-05-30 NOTE — Progress Notes (Signed)
Subjective: 57 year old female presents the office today for evaluation of bilateral foot pain, plantar fasciitis with concern for neuropathy/neuritis.  She still getting sharp pain about her heel on both sides.  She is to continue with stretching, icing daily.  She is scheduled for nerve conduction test next week.  She denies any recent injury or trauma or any changes since I last saw her. Denies any systemic complaints such as fevers, chills, nausea, vomiting. No acute changes since last appointment, and no other complaints at this time.   Objective: AAO x3, NAD DP/PT pulses palpable bilaterally, CRT less than 3 seconds Tenderness to palpation along the plantar medial tubercle of the calcaneus at the insertion of plantar fascia on the left and right foot. There is no pain along the course of the plantar fascia within the arch of the foot. Plantar fascia appears to be intact. There is no pain with lateral compression of the calcaneus or pain with vibratory sensation. There is no pain along the course or insertion of the achilles tendon. No other areas of tenderness to bilateral lower extremities.  There is a mild positive Tinel sign.  There is some mild edema to the heel bilaterally as well but there is no erythema warmth. No open lesions or pre-ulcerative lesions.  No pain with calf compression, swelling, warmth, erythema  Assessment: Bilateral plantar fasciitis is still concerned with neuritis symptoms  Plan: -All treatment options discussed with the patient including all alternatives, risks, complications.  -Bilateral injections were performed today.  See procedure note below. -Continue stretching, icing daily.  Continue plantar fascial braces.  They seem to be helping some.  Also continue supportive shoes and orthotics.  Nerve conduction test next week.  If negative will consider MRI if no improvement with the injections. -Patient encouraged to call the office with any questions, concerns,  change in symptoms.   Trula Slade DPM

## 2018-05-30 NOTE — Telephone Encounter (Signed)
That is fine. Thanks 

## 2018-05-30 NOTE — Telephone Encounter (Signed)
Left message for pt to call for results  

## 2018-05-30 NOTE — Telephone Encounter (Signed)
-----   Message from Trula Slade, DPM sent at 05/26/2018  2:30 PM EST ----- Val- please let her know that the NCV is normal. Can you see how she did from the injections? Thanks.

## 2018-05-31 MED ORDER — NONFORMULARY OR COMPOUNDED ITEM: 5 refills | Status: DC

## 2018-05-31 NOTE — Telephone Encounter (Signed)
Left message informing pt Dr. Jacqualyn Posey was ordering an antiinflammatory medication from [facility] [phone], they would call with delivery and coverage information.

## 2018-06-01 ENCOUNTER — Encounter: Payer: Self-pay | Admitting: Neurology

## 2018-06-01 ENCOUNTER — Encounter

## 2018-06-15 ENCOUNTER — Ambulatory Visit: Payer: BLUE CROSS/BLUE SHIELD | Admitting: Podiatry

## 2018-06-15 ENCOUNTER — Encounter: Payer: Self-pay | Admitting: Podiatry

## 2018-06-15 DIAGNOSIS — M792 Neuralgia and neuritis, unspecified: Secondary | ICD-10-CM

## 2018-06-15 DIAGNOSIS — M722 Plantar fascial fibromatosis: Secondary | ICD-10-CM

## 2018-06-15 DIAGNOSIS — M779 Enthesopathy, unspecified: Secondary | ICD-10-CM

## 2018-06-15 NOTE — Progress Notes (Signed)
Subjective: 57 year old female presents the office today for follow-up evaluation of bilateral foot pain.  She states that the injections did help some of the pain started to come back.  She is not sure if it is because she is been on her feet quite a bit with the holidays and she is not had time to rest her feet.  She also states her husband's been in the hospital she is been on her feet a lot because of this.  He still gets pain mostly to the bottom of the heels but she is also getting pain going on the ankles.  She is on the Lyrica that was prescribed by her primary care physician was helped some she thinks. Denies any systemic complaints such as fevers, chills, nausea, vomiting. No acute changes since last appointment, and no other complaints at this time.   Objective: AAO x3, NAD DP/PT pulses palpable bilaterally, CRT less than 3 seconds There is tenderness palpation of the plantar medial tubercle of the calcaneus and insertion of the plantar fascia bilaterally.  No pain with lateral compression of the calcaneus.  No pain to the Achilles tendon.  Plantar fascia, Achilles tendon appear to be intact.  On the medial aspect of bilateral ankle along the course of the flexor, posterior tibial tendons she has discomfort as well but there is no edema, erythema to this area.  There is no area pinpoint bony tenderness.  No open lesions or pre-ulcerative lesions.  No pain with calf compression, swelling, warmth, erythema  Assessment: Bilateral chronic foot pain, plantar fasciitis/tendinitis  Plan: -All treatment options discussed with the patient including all alternatives, risks, complications.  -Today second steroid injection was performed at her request.  See procedure note below. -Dispensed power steps today.  She been wearing very flat sandals almost slippers and advised against this.  Discussed shoe gear modifications. -Plan to start physical therapy as well and a prescription for [facility] was written. -Reviewed the nerve conduction studies with her today as well. -Patient encouraged to call the office with any questions, concerns, change in symptoms.   Procedure: Injection Tendon/Ligament Discussed alternatives, risks, complications and verbal consent was obtained.  Location: Bilateral plantar fascia at the glabrous junction; medial approach. Skin Prep: Alcohol. Injectate: 0.5cc 0.5% marcaine plain, 0.5 cc 2% lidocaine plain and, 1 cc kenalog 10. Disposition: Patient tolerated procedure well. Injection site dressed with a band-aid.  Post-injection care was discussed and return precautions discussed.   Trula Slade DPM

## 2018-06-16 NOTE — Addendum Note (Signed)
Addended by: Cranford Mon R on: 06/16/2018 07:40 AM   Modules accepted: Orders

## 2018-06-23 ENCOUNTER — Other Ambulatory Visit (INDEPENDENT_AMBULATORY_CARE_PROVIDER_SITE_OTHER): Payer: BLUE CROSS/BLUE SHIELD

## 2018-06-23 ENCOUNTER — Ambulatory Visit: Payer: BLUE CROSS/BLUE SHIELD | Admitting: Internal Medicine

## 2018-06-23 ENCOUNTER — Encounter

## 2018-06-23 ENCOUNTER — Encounter: Payer: Self-pay | Admitting: Internal Medicine

## 2018-06-23 VITALS — BP 130/70 | HR 62 | Ht 65.5 in | Wt 215.0 lb

## 2018-06-23 DIAGNOSIS — R109 Unspecified abdominal pain: Secondary | ICD-10-CM

## 2018-06-23 DIAGNOSIS — K219 Gastro-esophageal reflux disease without esophagitis: Secondary | ICD-10-CM

## 2018-06-23 DIAGNOSIS — F419 Anxiety disorder, unspecified: Secondary | ICD-10-CM

## 2018-06-23 DIAGNOSIS — F329 Major depressive disorder, single episode, unspecified: Secondary | ICD-10-CM

## 2018-06-23 DIAGNOSIS — K589 Irritable bowel syndrome without diarrhea: Secondary | ICD-10-CM | POA: Diagnosis not present

## 2018-06-23 DIAGNOSIS — R112 Nausea with vomiting, unspecified: Secondary | ICD-10-CM | POA: Diagnosis not present

## 2018-06-23 DIAGNOSIS — R635 Abnormal weight gain: Secondary | ICD-10-CM

## 2018-06-23 DIAGNOSIS — F32A Depression, unspecified: Secondary | ICD-10-CM

## 2018-06-23 LAB — CBC WITH DIFFERENTIAL/PLATELET
Basophils Absolute: 0.1 10*3/uL (ref 0.0–0.1)
Basophils Relative: 0.9 % (ref 0.0–3.0)
Eosinophils Absolute: 0.1 10*3/uL (ref 0.0–0.7)
Eosinophils Relative: 0.8 % (ref 0.0–5.0)
HCT: 38.2 % (ref 36.0–46.0)
Hemoglobin: 12.1 g/dL (ref 12.0–15.0)
Lymphocytes Relative: 33.5 % (ref 12.0–46.0)
Lymphs Abs: 2.2 10*3/uL (ref 0.7–4.0)
MCHC: 31.6 g/dL (ref 30.0–36.0)
MCV: 86.4 fl (ref 78.0–100.0)
MONOS PCT: 5.9 % (ref 3.0–12.0)
Monocytes Absolute: 0.4 10*3/uL (ref 0.1–1.0)
Neutro Abs: 3.8 10*3/uL (ref 1.4–7.7)
Neutrophils Relative %: 58.9 % (ref 43.0–77.0)
Platelets: 289 10*3/uL (ref 150.0–400.0)
RBC: 4.43 Mil/uL (ref 3.87–5.11)
RDW: 14.6 % (ref 11.5–15.5)
WBC: 6.5 10*3/uL (ref 4.0–10.5)

## 2018-06-23 LAB — COMPREHENSIVE METABOLIC PANEL
ALT: 16 U/L (ref 0–35)
AST: 14 U/L (ref 0–37)
Albumin: 4.1 g/dL (ref 3.5–5.2)
Alkaline Phosphatase: 99 U/L (ref 39–117)
BUN: 13 mg/dL (ref 6–23)
CO2: 31 mEq/L (ref 19–32)
Calcium: 9.3 mg/dL (ref 8.4–10.5)
Chloride: 104 mEq/L (ref 96–112)
Creatinine, Ser: 1.05 mg/dL (ref 0.40–1.20)
GFR: 69.68 mL/min (ref >60.00)
Glucose, Bld: 90 mg/dL (ref 70–99)
POTASSIUM: 5.1 meq/L (ref 3.5–5.1)
Sodium: 141 mEq/L (ref 135–145)
TOTAL PROTEIN: 7.1 g/dL (ref 6.0–8.3)
Total Bilirubin: 0.2 mg/dL (ref 0.2–1.2)

## 2018-06-23 LAB — TSH: TSH: 1.44 u[IU]/mL (ref 0.35–4.50)

## 2018-06-23 MED ORDER — PANCRELIPASE (LIP-PROT-AMYL) 40000-126000 UNITS PO CPEP: 3.0000 | ORAL_CAPSULE | Freq: Three times a day (TID) | ORAL | 1 refills | Status: DC

## 2018-06-23 MED ORDER — AMITRIPTYLINE HCL 50 MG PO TABS: 50.0000 mg | ORAL_TABLET | Freq: Every day | ORAL | 3 refills | Status: DC

## 2018-06-23 MED ORDER — PANCRELIPASE (LIP-PROT-AMYL) 40000-126000 UNITS PO CPEP: 2.0000 | ORAL_CAPSULE | Freq: Three times a day (TID) | ORAL | 1 refills | Status: DC

## 2018-06-23 NOTE — Patient Instructions (Addendum)
Discontinue Lomotil (diphenoxylate)  Discontinue Bentyl (dicyclomine)  Continue omeprazole 20 mg every morning.  We have sent the following medications to your pharmacy for you to pick up at your convenience: Zenpep 3 capsules by mouth before meals, 1 capsule by mouth with snacks Amitriptyline 50 mg every night.  Check at home to see if you have robinul (glycopyrolate). Take this every 12 hours for abdominal pain; or at least before dinner.  You have been scheduled for a CT scan of the abdomen and pelvis at [facility] ([address]---this is in the same building as [facility]).   You are scheduled on 07/04/2018 at 9:00 am. You should arrive 15 minutes prior to your appointment time for registration. Please follow the written instructions below on the day of your exam:  WARNING: IF YOU ARE ALLERGIC TO IODINE/X-RAY DYE, PLEASE NOTIFY RADIOLOGY IMMEDIATELY AT [phone]! YOU WILL BE GIVEN A 13 HOUR PREMEDICATION PREP.  1) Do not eat or drink anything after 5:00 am (4 hours prior to your test) 2) You have been given 2 bottles of oral contrast to drink. The solution may taste better if refrigerated, but do NOT add ice or any other liquid to this solution. Shake well before drinking.    Drink 1 bottle of contrast @ 7:00 am (2 hours prior to your exam)  Drink 1 bottle of contrast @ 8:00 am (1 hour prior to your exam)  You may take any medications as prescribed with a small amount of water, if necessary. If you take any of the following medications: METFORMIN, GLUCOPHAGE, GLUCOVANCE, AVANDAMET, RIOMET, FORTAMET, [place] MET, JANUMET, GLUMETZA or METAGLIP, you MAY be asked to HOLD this medication 48 hours AFTER the exam.  The purpose of you drinking the oral contrast is to aid in the visualization of your intestinal tract. The contrast solution may cause some diarrhea. Depending on your individual set of symptoms, you may also receive an intravenous injection of x-ray  contrast/dye. Plan on being at [facility] for 30 minutes or longer, depending on the type of exam you are having performed.  This test typically takes 30-45 minutes to complete.  If you have any questions regarding your exam or if you need to reschedule, you may call the CT department at [phone] between the hours of 8:00 am and 5:00 pm, Monday-Friday.  ________________________________________________________________________  If you are age 67 or older, your body mass index should be between 23-30. Your Body mass index is 35.23 kg/m. If this is out of the aforementioned range listed, please consider follow up with your Primary Care Provider.  If you are age 73 or younger, your body mass index should be between 19-25. Your Body mass index is 35.23 kg/m. If this is out of the aformentioned range listed, please consider follow up with your Primary Care Provider.

## 2018-06-24 ENCOUNTER — Telehealth: Payer: Self-pay | Admitting: Gastroenterology

## 2018-06-24 NOTE — Telephone Encounter (Signed)
Received call for medication refill for Traci Johnston.  Was seen by Dr. Duaine Dredge yesterday in clinic for IBS.  Was told to check to see if she has any more Traci Johnston at home which has worked for her in the past.  Unfortunately, she has a lot of this medication requesting a refill.  Refill called in to [facility] in [place] on [address]. per patient request.  She states she otherwise feels in her usual state of health.  Glycopyrrolate 2 mg tablets Sig: Take 1 tablet by mouth every 12 hours Dispense: #60 Refills: 3  To follow-up with Dr. Hilarie Fredrickson as previously planned.  All questions answered.

## 2018-06-26 ENCOUNTER — Telehealth: Payer: Self-pay | Admitting: Internal Medicine

## 2018-06-26 ENCOUNTER — Encounter: Payer: Self-pay | Admitting: Internal Medicine

## 2018-06-26 NOTE — Progress Notes (Signed)
Subjective:    Patient ID: Traci Johnston, female    DOB: 11/26/1960, 58 y.o.   MRN: [id number]  HPI Traci Johnston is a 58 yo female with PMH of IBS, hx of gallstones s/p cholecystectomy with post-surgical CBD dilation, hx of H. Pylori s/p treatment, hx of anxiety and depression here for follow-up.  She is here alone today and was last seen in June 2019.    She reports that she has been having issues with left-sided crampy abdominal pain and loose stools after eating.  She is also had a recent issue with nausea and vomiting after meal.  This is often when she starts to develop her crampy abdominal pain and loose stool.  She is only eating 1 main meal per day.  For the most part she avoids gluten but not strictly.  When she has her discomfort she will lie down to keep the pressure off of her abdomen and it feels better when she is lying on her left side.  She has Robinul Forte on her med list but she is not sure she is using it.  She is taking Zenpep 3 capsules with each meal, which for her is only 1 meal per day.  Using omeprazole 20 mg in the morning.  Not using Bentyl or Lomotil.  She does continue Align 1 capsule daily and Elavil 50 mg at bedtime.  No blood in her stool or melena.  She reports considerable stress beginning in November which was the 5-year anniversary of her mother's death.  Her husband also recently had an admission with volume overload due to congestive heart failure.  This is been quite stressful for her.  Her son Traci Johnston, who was in the [employer], has had an injury and is not playing this season.  He also has required a surgery.  She is concerned also about a 15 pound weight gain since June 2019.  This is hard for her to understand particular given her relatively low oral intake.  She tried IBgard without benefit  Review of Systems As per HPI, otherwise negative  Current Medications, Allergies, Past Medical History, Past Surgical History, Family History and Social History were  reviewed in [employer] record.     Objective:   Physical Exam BP 130/70   Pulse 62   Ht 5' 5.5" (1.664 m)   Wt 215 lb (97.5 kg)   BMI 35.23 kg/m  Gen: awake, alert, NAD HEENT: anicteric, op clear CV: RRR, no mrg Pulm: CTA b/l Abd: soft, left upper and middle quadrant tenderness without rebound or guarding, tenderness is moderate, +BS throughout Ext: no c/c/e Neuro: nonfocal     Assessment & Plan:  58 yo female with PMH of IBS, hx of gallstones s/p cholecystectomy with post-surgical CBD dilation, hx of H. Pylori s/p treatment, hx of anxiety and depression here for follow-up.  1. IBS/chronic abdominal pain with acute on chronic abdominal pain --probable IBS flare in the setting of stress however given the tenderness on exam today I will check CBC, CMP and perform a CT scan of the abdomen pelvis with IV contrast. If CT is unremarkable then the cause of her symptoms are likely colonic spasm due to IBS then triggering vomiting. She is not using antispasmodic and so would like her to resume Robinul forte 2 mg every 12 hours Continue Zenpep 3 tablets with each meal Continue amitriptyline 50 mg at bedtime  2.  GERD/dyspepsia --heartburn and dyspeptic symptoms have improved with PPI.  Continue omeprazole  20 mg daily  3.  Weight gain --check TSH.  Patient will discuss with her primary provider  30 minutes spent with the patient today. Greater than 50% was spent in counseling and coordination of care with the patient

## 2018-06-27 NOTE — Telephone Encounter (Signed)
Discussed with pt that the robinul is most likely not causing the discomfort as this is not a new med for her, she was started on it back in June. Pt also states she is only eating one meal a day. She is supposed to be taking zenpep tid and with snacks. Discussed with her that she needs to try and eat more and she could perhaps add in a supplement such as ensure or boost if she cannot eat a meal.

## 2018-07-04 ENCOUNTER — Ambulatory Visit (INDEPENDENT_AMBULATORY_CARE_PROVIDER_SITE_OTHER)
Admission: RE | Admit: 2018-07-04 | Discharge: 2018-07-04 | Disposition: A | Discharge status: Home / Self Care | Payer: BLUE CROSS/BLUE SHIELD | Source: Ambulatory Visit | Attending: Internal Medicine | Admitting: Internal Medicine

## 2018-07-04 DIAGNOSIS — R109 Unspecified abdominal pain: Secondary | ICD-10-CM | POA: Diagnosis not present

## 2018-07-04 MED ORDER — IOPAMIDOL (ISOVUE-300) INJECTION 61%
100.0000 mL | Freq: Once | INTRAVENOUS | Status: AC | PRN
  Administered 2018-07-04: 100 mL via INTRAVENOUS

## 2018-07-05 ENCOUNTER — Telehealth: Payer: Self-pay | Admitting: Internal Medicine

## 2018-07-05 NOTE — Telephone Encounter (Signed)
Pt called in inquiring about ct results.

## 2018-07-05 NOTE — Telephone Encounter (Signed)
Pt calling for CT results

## 2018-07-05 NOTE — Telephone Encounter (Signed)
The pt has been notified via My Chart per Rosanne Sack, RN

## 2018-07-05 NOTE — Telephone Encounter (Signed)
See result note.  

## 2018-07-06 NOTE — Telephone Encounter (Signed)
Spoke with pt and she is aware.

## 2018-07-06 NOTE — Telephone Encounter (Signed)
Pt called back in and stated she do not have access to my chart and would like for a call back with her results.

## 2018-07-06 NOTE — Telephone Encounter (Signed)
Can be based on symptoms but otherwise I would say 3 to 6 months

## 2018-07-06 NOTE — Telephone Encounter (Signed)
Pt aware of CT results, see result note. Pt wants to know when she needs to come back and be seen. Please advise.

## 2018-07-13 ENCOUNTER — Ambulatory Visit: Payer: BLUE CROSS/BLUE SHIELD | Admitting: Podiatry

## 2018-07-13 DIAGNOSIS — M722 Plantar fascial fibromatosis: Secondary | ICD-10-CM | POA: Diagnosis not present

## 2018-07-13 MED ORDER — TRIAMCINOLONE ACETONIDE 10 MG/ML IJ SUSP
10.0000 mg | Freq: Once | INTRAMUSCULAR | Status: AC
  Administered 2018-07-13: 10 mg

## 2018-07-20 DIAGNOSIS — M722 Plantar fascial fibromatosis: Secondary | ICD-10-CM | POA: Insufficient documentation

## 2018-07-20 NOTE — Progress Notes (Signed)
Subjective: 58 year old female presents the office today for follow-up evaluation of bilateral plantar fasciitis.  She is the injections did help her about 2 weeks but the pain started come back which is been on her feet quite a bit as her husband's been in the hospital which is been on her feet a lot.  She states that the Voltaren gel did not help.  She states that since being on her feet a lot the pain is returned with the left side worse than the right.  She is also scheduled to start physical therapy.  She denies any recent injury or trauma or any increase in swelling. Denies any systemic complaints such as fevers, chills, nausea, vomiting. No acute changes since last appointment, and no other complaints at this time.   Objective: AAO x3, NAD DP/PT pulses palpable bilaterally, CRT less than 3 seconds There is tenderness palpation on the plantar medial tubercle of the calcaneus at the insertion of plantar fascia with the left side worse than the right.  There is no pain with lateral compression of the calcaneus.  No pain on the course or insertion of the Achilles tendon.  No area pinpoint bony tenderness.  No pain with vibratory sensation identified.  Minimal edema in the left plantar heel. No open lesions or pre-ulcerative lesions.  No pain with calf compression, swelling, warmth, erythema  Assessment: Bilateral plantar fasciitis left side worse than right  Plan: -All treatment options discussed with the patient including all alternatives, risks, complications.  -Today steroid injection was performed in the left side.  See procedure note below.  Continue with stretching, ice exercises.  Continue with supportive shoes and discussed orthotics.  She is scheduled to start physical therapy.  We will continue with this. -Patient encouraged to call the office with any questions, concerns, change in symptoms.   Procedure: Injection Tendon/Ligament Discussed alternatives, risks, complications and  verbal consent was obtained.  Location: Left plantar fascia at the glabrous junction; medial approach. Skin Prep: Alcohol. Injectate: 0.5cc 0.5% marcaine plain, 0.5 cc 2% lidocaine plain and, 1 cc kenalog 10. Disposition: Patient tolerated procedure well. Injection site dressed with a band-aid.  Post-injection care was discussed and return precautions discussed.    Trula Slade DPM

## 2018-07-21 ENCOUNTER — Other Ambulatory Visit: Payer: Self-pay | Admitting: Internal Medicine

## 2018-08-10 ENCOUNTER — Ambulatory Visit: Payer: BLUE CROSS/BLUE SHIELD | Admitting: Podiatry

## 2018-08-15 ENCOUNTER — Encounter: Payer: Self-pay | Admitting: Podiatry

## 2018-08-15 ENCOUNTER — Ambulatory Visit: Payer: BLUE CROSS/BLUE SHIELD | Admitting: Podiatry

## 2018-08-15 DIAGNOSIS — L600 Ingrowing nail: Secondary | ICD-10-CM

## 2018-08-15 DIAGNOSIS — M722 Plantar fascial fibromatosis: Secondary | ICD-10-CM | POA: Diagnosis not present

## 2018-08-15 MED ORDER — TRAMADOL HCL 50 MG PO TABS: 50.0000 mg | ORAL_TABLET | Freq: Two times a day (BID) | ORAL | 0 refills | Status: DC | PRN

## 2018-08-15 MED ORDER — CEPHALEXIN 500 MG PO CAPS: 500.0000 mg | ORAL_CAPSULE | Freq: Three times a day (TID) | ORAL | 0 refills | Status: DC

## 2018-08-15 NOTE — Patient Instructions (Signed)

## 2018-08-16 DIAGNOSIS — L600 Ingrowing nail: Secondary | ICD-10-CM | POA: Insufficient documentation

## 2018-08-16 NOTE — Progress Notes (Signed)
Subjective: 58 year old female presents the office today for follow-up evaluation of bilateral heel pain, plantar fasciitis.  She said the left side is doing well.  She still getting some pain on the right side is interested in an injection on the right side today.  Also she has been doing physical therapy which is been helpful.  She has secondary concerns of a painful ingrown toenails the right big toe, medial aspect and she is noticed redness and pain to this area.  She is not able to put shoes on without pain and she is even having pain with sheets in the bed.  She states that she had a hangnail that she tried to get out.  Denies any pus.  Denies any systemic complaints such as fevers, chills, nausea, vomiting. No acute changes since last appointment, and no other complaints at this time.   Objective: AAO x3, NAD DP/PT pulses palpable bilaterally, CRT less than 3 seconds There is improved yet continued tenderness palpation of plantar medial tubercle of the calcaneus at the insertion of the plantar fascia for the right side worse than the left.  No bilateral compression of calcaneus.  No significant edema.  Overall she is doing much better. There is incurvation present to the medial aspect of the right hallux toenail is localized edema and erythema to the nail corner but there is no drainage or pus.  No ascending cellulitis.  No fluctuation crepitation any malodor. No open lesions or pre-ulcerative lesions.  No pain with calf compression, swelling, warmth, erythema  Assessment: Bilateral heel pain the plantar fasciitis with right medial hallux ingrown toenail with localized erythema  Plan: -All treatment options discussed with the patient including all alternatives, risks, complications.  -Regards to the heel pain continue stretching, ice exercises daily.  We will do a steroid injection on the right side but given the localized infection on the right big toenail would hold off on the injection  today. -At this time, recommended partial nail removal without chemical matricectomy to the right medial hallux.  Risks and complications were discussed with the patient for which they understand and  verbally consent to the procedure. Under sterile conditions a total of 3 mL of a mixture of 2% lidocaine plain and 0.5% Marcaine plain was infiltrated in a hallux block fashion. Once anesthetized, the skin was prepped in sterile fashion. A tourniquet was then applied. Next the medial border of the hallux nail border was sharply excised making sure to remove the entire offending nail border. Once the nail was  Removed, the area was debrided and the underlying skin was intact. The area was irrigated and hemostasis was obtained.  A dry sterile dressing was applied. After application of the dressing the tourniquet was removed and there is found to be an immediate capillary refill time to the digit. The patient tolerated the procedure well any complications. Post procedure instructions were discussed the patient for which he verbally understood. Follow-up in one week for nail check or sooner if any problems are to arise. Discussed signs/symptoms of worsening infection and directed to call the office immediately should any occur or go directly to the emergency room. In the meantime, encouraged to call the office with any questions, concerns, changes symptoms. -Keflex -Patient encouraged to call the office with any questions, concerns, change in symptoms.   Trula Slade DPM

## 2018-08-24 ENCOUNTER — Encounter: Payer: Self-pay | Admitting: Podiatry

## 2018-08-24 ENCOUNTER — Ambulatory Visit: Payer: BLUE CROSS/BLUE SHIELD | Admitting: Podiatry

## 2018-08-24 DIAGNOSIS — L600 Ingrowing nail: Secondary | ICD-10-CM | POA: Diagnosis not present

## 2018-08-24 NOTE — Patient Instructions (Signed)

## 2018-08-25 NOTE — Progress Notes (Signed)
Subjective: 58 year old female presents the office today for follow-up evaluation of ongoing right medial hallux partial nail avulsion performed.  She said that has been doing well however she has noticed that the lateral aspect started become painful and ingrown.  She is having no issues with the site before. Denies any systemic complaints such as fevers, chills, nausea, vomiting. No acute changes since last appointment, and no other complaints at this time.   Objective: AAO x3, NAD DP/PT pulses palpable bilaterally, CRT less than 3 seconds Status post partial nail avulsion on the medial aspect of the right hallux toenail with a scab present there is no significant tenderness there is palpation of this area.  No edema, erythema, drainage or pus.  However there is incurvation present on the lateral aspect measuring small and superficial purulence present under the distal portion of the nail.  There is localized edema to this area but no significant erythema no ascending cellulitis. No pain with calf compression, swelling, warmth, erythema  Assessment: Right lateral hallux ingrown toenail  Plan: -All treatment options discussed with the patient including all alternatives, risks, complications.  -Procedure site is doing well on the medial aspect however the lateral aspect become ingrown there is localized infection of this area.  Because of this the procedure was performed on the lateral aspect today. -At this time, the patient is requesting partial nail removal with chemical matricectomy to the symptomatic portion of the nail. Risks and complications were discussed with the patient for which they understand and written consent was obtained. Under sterile conditions a total of 3 mL of a mixture of 2% lidocaine plain and 0.5% Marcaine plain was infiltrated in a hallux block fashion. Once anesthetized, the skin was prepped in sterile fashion. A tourniquet was then applied. Next the LATERAL aspect of  hallux nail border was then sharply excised making sure to remove the entire offending nail border. Once the nails were ensured to be removed area was debrided and the underlying skin was intact. There is no purulence identified in the procedure. Next phenol was then applied under standard conditions and copiously irrigated. Silvadene was applied. A dry sterile dressing was applied. After application of the dressing the tourniquet was removed and there is found to be an immediate capillary refill time to the digit. The patient tolerated the procedure well any complications. Post procedure instructions were discussed the patient for which he verbally understood. Follow-up in one week for nail check or sooner if any problems are to arise. Discussed signs/symptoms of infection and directed to call the office immediately should any occur or go directly to the emergency room. In the meantime, encouraged to call the office with any questions, concerns, changes symptoms. -Heels are doing somewhat better.  Is going to physical therapy. If the toe is doing well then we will do a steroid injection next appointment  -Patient encouraged to call the office with any questions, concerns, change in symptoms.   Trula Slade DPM

## 2018-08-31 ENCOUNTER — Ambulatory Visit: Payer: BLUE CROSS/BLUE SHIELD | Admitting: Podiatry

## 2018-09-08 ENCOUNTER — Telehealth: Payer: Self-pay | Admitting: Internal Medicine

## 2018-09-08 NOTE — Telephone Encounter (Signed)
Dr. Hilarie Fredrickson, patient is scheduled for 09/12/2018 and due to the Covid-19 patient is requesting a telephone visit because she is having issues with her IBS that needs addressed.

## 2018-09-12 ENCOUNTER — Telehealth (INDEPENDENT_AMBULATORY_CARE_PROVIDER_SITE_OTHER): Payer: BLUE CROSS/BLUE SHIELD | Admitting: Internal Medicine

## 2018-09-12 ENCOUNTER — Other Ambulatory Visit: Payer: Self-pay

## 2018-09-12 DIAGNOSIS — B349 Viral infection, unspecified: Secondary | ICD-10-CM

## 2018-09-12 DIAGNOSIS — K219 Gastro-esophageal reflux disease without esophagitis: Secondary | ICD-10-CM

## 2018-09-12 DIAGNOSIS — K582 Mixed irritable bowel syndrome: Secondary | ICD-10-CM

## 2018-09-12 NOTE — Progress Notes (Signed)
Telehealth visit in the setting of COVID-19 outbreak/pandemic  This service was provided via telemedicine.   The patient was located at home The provider was located in medical office The patient did consent to this telephone visit and is aware of possible charges through their insurance for this visit.   The other persons participating in this telemedicine service were myself, patient and Quintin Alto, [place] Time spent on call: 25 minutes   HPI: Traci Johnston is a 58 year old female with a history of IBS, gallstones status post cholecystectomy with postsurgical CBD dilatation, history of H. pylori status post treatment with documentation of eradication, colonic diverticulosis, anxiety and depression who is here for follow-up/seen virtually.  Sick x 2.5 weeks.  Running fevers (as high as 105), went to family MD.  She told her she had the flu and to self quarantine.  Family was interacting with masks and gloves.  Had diarrhea and was using antidiarrheals.   Has cut back on coffee, now 1 cup per day.   Dry cough which is better, but still present at night.  Tessalon perrls hasn't been too helpful.  No other family members got sick, but she stayed isolated.  The last 2 days she is felt more back to normal.  Still eating only in the evening.  1 meal/day.  Gave abx, zpak, and took 2 weeks to get better.    Using Zenpep 3 with meal.    Had a CT scan after last OV, which I reviewed and was reassuring.    Has used the Robinul forte but not using routinely.  Unaware of what to best use this for.  Past Medical History:  Diagnosis Date  . Anxiety   . Common bile duct dilatation   . Depression   . Gallstones   . GERD (gastroesophageal reflux disease)    hx. "Hypylori"  . Helicobacter pylori gastritis   . History of kidney stones   . IBS (irritable bowel syndrome)   . 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 ABDOMINAL HYSTERECTOMY    .  APPENDECTOMY    . CHOLECYSTECTOMY    . COLONOSCOPY    . ROBOT ASSISTED LAPAROSCOPIC NEPHRECTOMY Right 01/08/2013   Procedure: ROBOTIC ASSISTED LAPAROSCOPIC NEPHRECTOMY;  Surgeon: Dutch Gray, MD;  Location: [facility];  Service: Urology;  Laterality: Right;  PARTIAL NEPHRECTOMY   . UMBILICAL HERNIA REPAIR     6 grade    Outpatient Medications Prior to Visit  Medication Sig Dispense Refill  . ALPRAZolam (XANAX) 0.5 MG tablet Take 0.5 mg by mouth as needed for anxiety.    . ALPRAZolam (XANAX) 1 MG tablet   0  . amitriptyline (ELAVIL) 50 MG tablet Take 1 tablet (50 mg total) by mouth at bedtime. 30 tablet 3  . amLODipine (NORVASC) 5 MG tablet   3  . bifidobacterium infantis (ALIGN) capsule Take 1 capsule by mouth daily. 7 capsule 0  . cephALEXin (KEFLEX) 500 MG capsule Take 1 capsule (500 mg total) by mouth 3 (three) times daily. 21 capsule 0  . fluconazole (DIFLUCAN) 100 MG tablet TK 2 TS PO ONCE A DAY    . FLUoxetine (PROZAC) 40 MG capsule Take by mouth.    . fluticasone (FLONASE) 50 MCG/ACT nasal spray SHAKE LQ AND U 1 SPR IEN BID  3  . gabapentin (NEURONTIN) 100 MG capsule Take 1 capsule (100 mg total) by mouth 3 (three) times daily. 30 capsule 1  .  glycopyrrolate (ROBINUL) 2 MG tablet Take 1 tablet (2 mg total) by mouth every 12 (twelve) hours. 60 tablet 3  . NONFORMULARY OR COMPOUNDED [facility]:  Pain Cream #16 - Diclofenac 3%, Baclofen 2%, Lidocaine 5%, Menthol 1%, apply 1-2 grams to affected area 3-4 times daily. 100 each 5  . Omeprazole 20 MG TBEC Take 1 tablet by mouth 2 (two) times daily.    . Pancrelipase, Lip-Prot-Amyl, (ZENPEP) 40000-126000 units CPEP Take 3 capsules by mouth 3 (three) times daily before meals. 1 capsule with each snack. (pharmacy-please d/c rx for 2 capsule tid dosing) 990 capsule 1  . PRISTIQ 50 MG 24 hr tablet Take 50 mg by mouth daily.  4  . traMADol (ULTRAM) 50 MG tablet Take 1 tablet (50 mg total) by mouth every 12 (twelve) hours as needed. 10  tablet 0   No facility-administered medications prior to visit.     Allergies  Allergen Reactions  . Aspirin     hyperventilate  . Codeine     hyperventilates    Family History  Problem Relation Age of Onset  . Breast cancer Mother   . Prostate cancer Father   . Breast cancer Sister   . Colon cancer Neg Hx     Social History   Tobacco Use  . Smoking status: Former Smoker    Packs/day: 0.50    Years: 25.00    Pack years: 12.50    Types: Cigarettes    Last attempt to quit: 06/02/2012    Years since quitting: 6.2  . Smokeless tobacco: Never Used  . Tobacco comment: 05-2012///Will use electronic cigarette   Substance Use Topics  . Alcohol use: Yes    Comment: rare occ.  . Drug use: No    ROS: As per history of present illness, otherwise negative  There were no vitals taken for this visit. Marland Kitchenno PE, virtual visit  RELEVANT LABS AND IMAGING: CBC    Component Value Date/Time   WBC 6.5 06/23/2018 1707   RBC 4.43 06/23/2018 1707   HGB 12.1 06/23/2018 1707   HCT 38.2 06/23/2018 1707   PLT 289.0 06/23/2018 1707   MCV 86.4 06/23/2018 1707   MCH 28.6 02/04/2016 1840   MCHC 31.6 06/23/2018 1707   RDW 14.6 06/23/2018 1707   LYMPHSABS 2.2 06/23/2018 1707   MONOABS 0.4 06/23/2018 1707   EOSABS 0.1 06/23/2018 1707   BASOSABS 0.1 06/23/2018 1707    CMP     Component Value Date/Time   NA 141 06/23/2018 1707   K 5.1 06/23/2018 1707   CL 104 06/23/2018 1707   CO2 31 06/23/2018 1707   GLUCOSE 90 06/23/2018 1707   BUN 13 06/23/2018 1707   CREATININE 1.05 06/23/2018 1707   CALCIUM 9.3 06/23/2018 1707   PROT 7.1 06/23/2018 1707   ALBUMIN 4.1 06/23/2018 1707   AST 14 06/23/2018 1707   ALT 16 06/23/2018 1707   ALKPHOS 99 06/23/2018 1707   BILITOT 0.2 06/23/2018 1707   GFRNONAA >60 02/04/2016 1840   GFRAA >60 02/04/2016 1840   CT ABDOMEN AND PELVIS WITH CONTRAST   TECHNIQUE: Multidetector CT imaging of the abdomen and pelvis was performed using the standard  protocol following bolus administration of intravenous contrast.   CONTRAST:  138mL ISOVUE-300 IOPAMIDOL (ISOVUE-300) INJECTION 61%   COMPARISON:  11/12/2014   FINDINGS: Lower chest: Lung bases are clear.   Hepatobiliary: Low-density lesion in the LEFT lateral hepatic lobe measuring 4 mm (image 20/2) is too small characterize. A small  cysts within the liver on comparison MRI from 09/29/2013 correlates to this lesion. Postcholecystectomy. prominence of the common hepatic duct and common bile duct similar to comparison MRCP.   Pancreas: Pancreas is normal. No ductal dilatation. No pancreatic inflammation.   Spleen: Normal spleen   Adrenals/urinary tract: Adrenal glands and kidneys are normal. The ureters and bladder normal.   Stomach/Bowel: Stomach, small bowel, appendix, and cecum are normal. The colon and rectosigmoid colon are normal.   Vascular/Lymphatic: Abdominal aorta is normal caliber. No periportal or retroperitoneal adenopathy. No pelvic adenopathy.   Reproductive: Post hysterectomy anatomy.  No adnexal abnormality   Other: No free fluid.   Musculoskeletal: No aggressive osseous lesion.   IMPRESSION: 1. No explanation for LEFT-sided abdominal pain. 2. Postcholecystectomy with stable extrahepatic mild biliary duct dilatation. 3. No evidence of acute diverticulitis. No significant diverticular disease.     Electronically Signed   By: Suzy Bouchard M.D.   On: 07/04/2018 11:30  ASSESSMENT/PLAN: 58 year old female with a history of IBS, gallstones status post cholecystectomy with postsurgical CBD dilatation, history of H. pylori status post treatment with documentation of eradication, colonic diverticulosis, anxiety and depression who is here for follow-up/seen virtually.  1.  Acute viral infection --did not have flu test but sounds flulike and suspicious for COVID-19.  I explained to her that as many as 30 to 40% of COVID-19 patients experience diarrhea and  have GI symptoms.  Fortunately she has recovered and it does not sound like any of her family members have become ill.  2.  IBS/chronic abdominal pain --back to baseline.  Does better with pancreatic enzyme replacement which she is using 3 capsules of Zenpep before dinner.  Her dinner is her only meal of the day.  Continue amitriptyline 50 mg at bedtime.  I explained how she can use Robinul Forte 2 mg every 12 hours as needed for cramping abdominal pain and discomfort. --We reviewed her CT scan together today which was very reassuring  3.  GERD --symptoms well controlled.  She is using omeprazole 20 mg daily  Patient requests another telephone contact in early May and she can follow-up in the office in 3 to 6 months, sooner if needed  25 minutes spent with the patient today. Greater than 50% was spent in counseling and coordination of care with the patient     TF:TDDUK, [place], [place],  [id number]

## 2018-09-13 NOTE — Patient Instructions (Addendum)
Please schedule a follow up phone/televisit with Dr Hilarie Fredrickson in May 2020.  Please follow up in office with Dr Hilarie Fredrickson in 3-6 months.  Continue omeprazole.  Continue amitriptyline.  Continue robinul forte.  Continue your Zenpep.  It was a pleasure to speak to you today!  Dr. Loletha Carrow

## 2018-11-10 ENCOUNTER — Other Ambulatory Visit: Payer: Self-pay | Admitting: Internal Medicine

## 2018-11-10 ENCOUNTER — Telehealth: Payer: Self-pay | Admitting: Internal Medicine

## 2018-11-10 NOTE — Telephone Encounter (Signed)
Pt called and wanted to speak with the nurse. She stated that her stomach has been burning real real bad and need some advice.

## 2018-11-10 NOTE — Telephone Encounter (Signed)
The pt has continued GERD symptoms with burning in the chest.  She was advised at the last office visit in March to follow up in May with Dr Hilarie Fredrickson.  A message was left for the pt to call back and set up a follow up appt.

## 2018-11-14 ENCOUNTER — Encounter: Payer: Self-pay | Admitting: Internal Medicine

## 2018-11-14 ENCOUNTER — Other Ambulatory Visit: Payer: Self-pay

## 2018-11-14 ENCOUNTER — Ambulatory Visit (INDEPENDENT_AMBULATORY_CARE_PROVIDER_SITE_OTHER): Payer: BLUE CROSS/BLUE SHIELD | Admitting: Internal Medicine

## 2018-11-14 VITALS — Ht 65.5 in | Wt 210.0 lb

## 2018-11-14 DIAGNOSIS — R140 Abdominal distension (gaseous): Secondary | ICD-10-CM | POA: Diagnosis not present

## 2018-11-14 DIAGNOSIS — K582 Mixed irritable bowel syndrome: Secondary | ICD-10-CM

## 2018-11-14 DIAGNOSIS — R197 Diarrhea, unspecified: Secondary | ICD-10-CM | POA: Diagnosis not present

## 2018-11-14 DIAGNOSIS — R1013 Epigastric pain: Secondary | ICD-10-CM | POA: Diagnosis not present

## 2018-11-14 NOTE — Progress Notes (Signed)
Subjective:    Patient ID: Traci Johnston, female    DOB: Oct 24, 1960, 58 y.o.   MRN: [id number]  This service was provided via telemedicine.  Doximity App with AV communication The patient was located at home The provider was located in provider's GI office. The patient did consent to this telephone visit and is aware of possible charges through their insurance for this visit.   The persons participating in this telemedicine service were the patient and I. Time spent on call: 15 min   HPI Traci Johnston is a 58 year old female with a history of IBS with both diarrhea and constipation, gallstones status post cholecystectomy with postsurgical CBD dilatation, history of H. pylori status post treatment with documentation of eradication, colonic diverticulosis, anxiety and depression who is seen for follow-up.  She was last seen on 09/12/2018.  She reports that for the last for 5 days she has developed epigastric abdominal pain which has not improved in fact she feels that it is worsening.  She is also developed diarrhea or loose stools with incomplete evacuation.  Foul-smelling flatulence.  No bleeding.  Difficult to eat and the smell and taste of food is aversive to her.  She has been pretty much lying down.  No fevers or chills.  Trouble some abdominal bloating.  She has remained on omeprazole 20 mg once a day, glycopyrrolate twice daily, amitriptyline at bedtime, probiotic and 3 Zenpep capsules with meals.  She is still only eating dinner as her only meal per day.   Review of Systems As per HPI, otherwise negative  Current Medications, Allergies, Past Medical History, Past Surgical History, Family History and Social History were reviewed in [employer] record.     Objective:   Physical Exam No physical exam, virtual visit  CT ABDOMEN AND PELVIS WITH CONTRAST   TECHNIQUE: Multidetector CT imaging of the abdomen and pelvis was performed using the standard protocol  following bolus administration of intravenous contrast.   CONTRAST:  12mL ISOVUE-300 IOPAMIDOL (ISOVUE-300) INJECTION 61%   COMPARISON:  11/12/2014   FINDINGS: Lower chest: Lung bases are clear.   Hepatobiliary: Low-density lesion in the LEFT lateral hepatic lobe measuring 4 mm (image 20/2) is too small characterize. A small cysts within the liver on comparison MRI from 09/29/2013 correlates to this lesion. Postcholecystectomy. prominence of the common hepatic duct and common bile duct similar to comparison MRCP.   Pancreas: Pancreas is normal. No ductal dilatation. No pancreatic inflammation.   Spleen: Normal spleen   Adrenals/urinary tract: Adrenal glands and kidneys are normal. The ureters and bladder normal.   Stomach/Bowel: Stomach, small bowel, appendix, and cecum are normal. The colon and rectosigmoid colon are normal.   Vascular/Lymphatic: Abdominal aorta is normal caliber. No periportal or retroperitoneal adenopathy. No pelvic adenopathy.   Reproductive: Post hysterectomy anatomy.  No adnexal abnormality   Other: No free fluid.   Musculoskeletal: No aggressive osseous lesion.   IMPRESSION: 1. No explanation for LEFT-sided abdominal pain. 2. Postcholecystectomy with stable extrahepatic mild biliary duct dilatation. 3. No evidence of acute diverticulitis. No significant diverticular disease.     Electronically Signed   By: Suzy Bouchard M.D.   On: 07/04/2018 11:30       Assessment & Plan:  58 year old female with a history of IBS with both diarrhea and constipation, gallstones status post cholecystectomy with postsurgical CBD dilatation, history of H. pylori status post treatment with documentation of eradication, colonic diverticulosis, anxiety and depression who is seen for follow-up.  1.  IBS --history of alternating bowel habits but also abdominal pain.  Most likely she is having a flare of her irritable bowel but I would like to exclude an  infectious etiology to her diarrhea.  She had negative cross-sectional imaging earlier this year of the abdomen pelvis.  I have recommended the following: --Increase omeprazole to 20 mg twice daily AC --Hold off on taking further glycopyrrolate for a few days, can resume at 2 mg every 12 hours if needed if she finds this to be helpful after having been without it for several days --GI pathogen panel --If negative pathogen panel then rifaximin 550 mg 3 times daily x14 days for IBS with loose stools; I would expect this to improve her bloating, diarrhea and irritable bowel symptoms by resetting her gut micro-biome. --Continue Zenpep with meals --Continue amitriptyline at bedtime  Office follow-up next available if not improving, otherwise 3 to 6 months

## 2018-11-15 ENCOUNTER — Telehealth: Payer: Self-pay | Admitting: Internal Medicine

## 2018-11-15 MED ORDER — OMEPRAZOLE 20 MG PO TBEC: 1.0000 | DELAYED_RELEASE_TABLET | Freq: Two times a day (BID) | ORAL | 3 refills | Status: DC

## 2018-11-15 NOTE — Addendum Note (Signed)
Addended by: Larina Bras on: 11/15/2018 09:06 AM   Modules accepted: Orders

## 2018-11-15 NOTE — Telephone Encounter (Signed)
Pls call pt, she stated that Dr. Hilarie Fredrickson was going to order a bowel study to check her bms. I told her about the pathogen panel but she stated that it is not a blood work, pls call her to clarify.

## 2018-11-15 NOTE — Patient Instructions (Addendum)
Increase omeprazole to 20 mg twice daily before meals.  Hold off on taking further glycopyrrolate for a few days. You can resume at 2 mg every 12 hours if needed if you find this to be helpful after having been without it for several days.  Your provider has requested that you go to the basement level for lab work. Press "B" on the elevator. The lab is located at the first door on the left as you exit the elevator.  Continue Zenpep with meals.  Continue amitriptyline at bedtime  Please schedule an office follow-up, next available if not improving, otherwise follow up in 3 to 6 months.

## 2018-11-15 NOTE — Telephone Encounter (Signed)
Discussed with pt that she needs to come to the lab to get the kit to return her stool for the GI path panel. Pt states she was told she could just bring the stool. Let her know it has to be in a kit from the lab. Pt then stated that Dr. Hilarie Fredrickson was going to send the kit to the pharmacy. Discussed with pt again that she has to come to the lab to pick up kit and they will instruct her on how to collect and return the specimen. Pt verbalized understanding.

## 2018-11-16 ENCOUNTER — Other Ambulatory Visit: Payer: BLUE CROSS/BLUE SHIELD

## 2018-11-16 DIAGNOSIS — R140 Abdominal distension (gaseous): Secondary | ICD-10-CM

## 2018-11-16 DIAGNOSIS — R197 Diarrhea, unspecified: Secondary | ICD-10-CM

## 2018-11-16 DIAGNOSIS — R1013 Epigastric pain: Secondary | ICD-10-CM

## 2018-11-16 DIAGNOSIS — K582 Mixed irritable bowel syndrome: Secondary | ICD-10-CM

## 2018-11-17 ENCOUNTER — Other Ambulatory Visit: Payer: Self-pay | Admitting: Internal Medicine

## 2018-11-17 LAB — GASTROINTESTINAL PATHOGEN PANEL PCR
C. difficile Tox A/B, PCR: NOT DETECTED
Campylobacter, PCR: NOT DETECTED
Cryptosporidium, PCR: NOT DETECTED
E coli (ETEC) LT/ST PCR: NOT DETECTED
E coli (STEC) stx1/stx2, PCR: NOT DETECTED
E coli 0157, PCR: NOT DETECTED
Giardia lamblia, PCR: NOT DETECTED
Norovirus, PCR: NOT DETECTED
Rotavirus A, PCR: NOT DETECTED
Salmonella, PCR: NOT DETECTED
Shigella, PCR: NOT DETECTED

## 2018-11-17 MED ORDER — RIFAXIMIN 550 MG PO TABS: 550.0000 mg | ORAL_TABLET | Freq: Three times a day (TID) | ORAL | 0 refills | Status: DC

## 2018-11-20 ENCOUNTER — Telehealth: Payer: Self-pay | Admitting: Internal Medicine

## 2018-11-20 NOTE — Telephone Encounter (Signed)
Again went over patient's lab results and need for Xifaxan. She again verbalizes understanding.

## 2018-11-28 ENCOUNTER — Telehealth: Payer: Self-pay | Admitting: Internal Medicine

## 2018-11-28 DIAGNOSIS — R1013 Epigastric pain: Secondary | ICD-10-CM

## 2018-11-28 NOTE — Telephone Encounter (Signed)
Pt states she gave a stool specimen and it was negative. Pt was started on xifaxan but reports it has not helped at all, pt states her stomach hurts more now than it did before. Pt describes the pain like someone is punching her in the stomach and it comes and goes. Last week her stools were runny but now they are more normal. The discomfort is in the middle of her stomach. Pt wants to know what she can do, please advise.

## 2018-11-30 NOTE — Telephone Encounter (Signed)
Would recommend she come for labs  CBC, CMP, lipase  Ensure she is using the Robinul 2 mg every 12 hours Amitriptyline 50 mg qHS Omeprazole 40 mg daily  Thanks

## 2018-11-30 NOTE — Telephone Encounter (Signed)
Spoke with pt and she is aware, orders in epic. 

## 2018-12-01 ENCOUNTER — Other Ambulatory Visit: Payer: Self-pay | Admitting: Gastroenterology

## 2018-12-01 ENCOUNTER — Telehealth: Payer: Self-pay | Admitting: Internal Medicine

## 2018-12-01 MED ORDER — GLYCOPYRROLATE 2 MG PO TABS: 2.0000 mg | ORAL_TABLET | Freq: Two times a day (BID) | ORAL | 1 refills | Status: DC

## 2018-12-01 NOTE — Telephone Encounter (Signed)
Patient called in to request refill

## 2018-12-01 NOTE — Telephone Encounter (Signed)
Left message for patient to call back  

## 2018-12-01 NOTE — Telephone Encounter (Signed)
Patient notified of recommendations  She will come for labs on Monday

## 2018-12-01 NOTE — Telephone Encounter (Signed)
Patient is returning your call and did not want to leave the nurse a voicemail.

## 2018-12-01 NOTE — Telephone Encounter (Addendum)
Left message for patient to call back if she has questions. glycopyrrolate sent to pharmacy as requested.

## 2018-12-01 NOTE — Telephone Encounter (Signed)
Attempted to return call x 2 unable to leave a message

## 2018-12-01 NOTE — Telephone Encounter (Signed)
Pt requested a call back--stated that she was "not sure what you were saying."

## 2018-12-04 ENCOUNTER — Telehealth: Payer: Self-pay | Admitting: Internal Medicine

## 2018-12-04 NOTE — Telephone Encounter (Signed)
Patient states that she continues to have severe stomach pain which initially was "just in the middle" but now is all over. "Feels like she has been hit in stomach." Patient states that she is unable to get out of bed. Says she has continued diarrhea,  smells "awful." Per Dr Hilarie Fredrickson, have patient come see Nicoletta Ba, PA-C on Friday. Patient has been scheduled for 12/08/18 at 1:30 pm.

## 2018-12-04 NOTE — Telephone Encounter (Signed)
Patient has been advised that Dr Hilarie Fredrickson recommends she see Nicoletta Ba on 12/08/18. Patient is agreeable to this and verbalizes understanding.

## 2018-12-04 NOTE — Telephone Encounter (Signed)
Pt calling back regarding this °

## 2018-12-07 ENCOUNTER — Other Ambulatory Visit (INDEPENDENT_AMBULATORY_CARE_PROVIDER_SITE_OTHER): Payer: BC Managed Care – PPO

## 2018-12-07 DIAGNOSIS — R1013 Epigastric pain: Secondary | ICD-10-CM | POA: Diagnosis not present

## 2018-12-07 LAB — CBC WITH DIFFERENTIAL/PLATELET
Basophils Absolute: 0 10*3/uL (ref 0.0–0.1)
Basophils Relative: 0.8 % (ref 0.0–3.0)
Eosinophils Absolute: 0.1 10*3/uL (ref 0.0–0.7)
Eosinophils Relative: 1.2 % (ref 0.0–5.0)
HCT: 39.6 % (ref 36.0–46.0)
Hemoglobin: 12.5 g/dL (ref 12.0–15.0)
Lymphocytes Relative: 49.3 % — ABNORMAL HIGH (ref 12.0–46.0)
Lymphs Abs: 2.9 10*3/uL (ref 0.7–4.0)
MCHC: 31.7 g/dL (ref 30.0–36.0)
MCV: 86.5 fl (ref 78.0–100.0)
Monocytes Absolute: 0.4 10*3/uL (ref 0.1–1.0)
Monocytes Relative: 6.5 % (ref 3.0–12.0)
Neutro Abs: 2.4 10*3/uL (ref 1.4–7.7)
Neutrophils Relative %: 42.2 % — ABNORMAL LOW (ref 43.0–77.0)
Platelets: 286 10*3/uL (ref 150.0–400.0)
RBC: 4.58 Mil/uL (ref 3.87–5.11)
RDW: 14.7 % (ref 11.5–15.5)
WBC: 5.8 10*3/uL (ref 4.0–10.5)

## 2018-12-07 LAB — COMPREHENSIVE METABOLIC PANEL
ALT: 15 U/L (ref 0–35)
AST: 20 U/L (ref 0–37)
Albumin: 4.2 g/dL (ref 3.5–5.2)
Alkaline Phosphatase: 111 U/L (ref 39–117)
BUN: 10 mg/dL (ref 6–23)
CO2: 32 mEq/L (ref 19–32)
Calcium: 9.8 mg/dL (ref 8.4–10.5)
Chloride: 102 mEq/L (ref 96–112)
Creatinine, Ser: 1.07 mg/dL (ref 0.40–1.20)
GFR: 64.04 mL/min (ref >60.00)
Glucose, Bld: 85 mg/dL (ref 70–99)
Potassium: 4.7 mEq/L (ref 3.5–5.1)
Sodium: 140 mEq/L (ref 135–145)
Total Bilirubin: 0.3 mg/dL (ref 0.2–1.2)
Total Protein: 7.3 g/dL (ref 6.0–8.3)

## 2018-12-07 LAB — LIPASE: Lipase: 35 U/L (ref 11.0–59.0)

## 2018-12-08 ENCOUNTER — Encounter: Payer: Self-pay | Admitting: Physician Assistant

## 2018-12-08 ENCOUNTER — Ambulatory Visit: Payer: BC Managed Care – PPO | Admitting: Physician Assistant

## 2018-12-08 ENCOUNTER — Telehealth: Payer: Self-pay

## 2018-12-08 ENCOUNTER — Telehealth: Payer: Self-pay | Admitting: Physician Assistant

## 2018-12-08 VITALS — BP 132/76 | HR 84 | Temp 98.4°F | Ht 66.0 in | Wt 207.0 lb

## 2018-12-08 DIAGNOSIS — K580 Irritable bowel syndrome with diarrhea: Secondary | ICD-10-CM

## 2018-12-08 DIAGNOSIS — K591 Functional diarrhea: Secondary | ICD-10-CM

## 2018-12-08 MED ORDER — CILIDINIUM-CHLORDIAZEPOXIDE 2.5-5 MG PO CAPS: 1.0000 | ORAL_CAPSULE | Freq: Three times a day (TID) | ORAL | 4 refills | Status: DC

## 2018-12-08 NOTE — Progress Notes (Addendum)
Subjective:    Patient ID: Traci Johnston, female    DOB: Oct 21, 1960, 58 y.o.   MRN: [id number]  HPI Traci Johnston is a 58 year old African-American female, known to Dr. Hilarie Fredrickson with history of IBS D, hyperplastic polyp, prior H. pylori infection treated,and is status post cholecystectomy postsurgical CBD dilation.  Also with chronic anxiety and depression. .  Patient had a viral illness in March 2020 and had virtual visit with Dr. Hilarie Fredrickson after that.  She had exacerbation of diarrhea, also had fever and cough.  She was treated with a Z-Pak by her PCP.  She was back to baseline from a GI standpoint and was asked to continue Zen Pep p.o. before meals and to continue Robinul Forte 2 mg p.o. twice daily and omeprazole for GERD. She had another virtual visit 11/14/2018 complaints of alternating bowel habits and abdominal pain.  It was felt she was having exacerbation of IBS.  She had had CT of the abdomen and pelvis in January 2020 which was unremarkable.  Omeprazole was increased, She has been on chronic amitriptyline and this was continued She had GI pathogen panel done which was negative and then started on a course of Xifaxan x14 days. Patient was unable to complete the entire course of Xifaxan because it made her feel sick with abdominal pain and nausea.  She believes she completed about 12 days. She comes back in the office today with ongoing complaints of diarrhea and abdominal pain.  She says she is taking the Robinul Forte twice daily and takes Zenpep 3 tablets with each meals but generally only eats once per day because she has more problems with IBS if she eats more frequent meals. She is generally waking up early in the morning with an episode of diarrhea may have 4-6 bowel movements the next several hours until about lunchtime.  Generally she feels okay later in the day and does not have more episodes of diarrhea.  She has not noted any melena or hematochezia.  She has been feeling ongoing abdominal  discomfort.  She does admit to feeling very stressed lately.  Other than the Xifaxan no new medications, no changes in medication dosages. Labs were done yesterday with CBC, c-Met lipase and TSH all within normal limits.   Review of Systems.Pertinent positive and negative review of systems were noted in the above HPI section.  All other review of systems was otherwise negative.  Outpatient Encounter Medications as of 12/08/2018  Medication Sig  . ALPRAZolam (XANAX) 1 MG tablet Take 1 mg by mouth 3 (three) times daily as needed.   Marland Kitchen amitriptyline (ELAVIL) 50 MG tablet TAKE 1 TABLET(50 MG) BY MOUTH AT BEDTIME  . amLODipine (NORVASC) 5 MG tablet Take 5 mg by mouth daily.   . bifidobacterium infantis (ALIGN) capsule Take 1 capsule by mouth daily.  Marland Kitchen FLUoxetine (PROZAC) 40 MG capsule Take 40 mg by mouth daily.   . fluticasone (FLONASE) 50 MCG/ACT nasal spray SHAKE LQ AND U 1 SPR IEN BID  . Omeprazole 20 MG TBEC Take 1 tablet (20 mg total) by mouth 2 (two) times daily.  . Pancrelipase, Lip-Prot-Amyl, (ZENPEP) 40000-126000 units CPEP Take 3 capsules by mouth 3 (three) times daily before meals. 1 capsule with each snack. (pharmacy-please d/c rx for 2 capsule tid dosing)  . pregabalin (LYRICA) 50 MG capsule Take 50 mg by mouth 3 (three) times daily. Taking as needed  . [DISCONTINUED] glycopyrrolate (ROBINUL) 2 MG tablet Take 1 tablet (2 mg total) by mouth every 12 (  twelve) hours.  . clidinium-chlordiazePOXIDE (LIBRAX) 5-2.5 MG capsule Take 1 capsule by mouth 3 (three) times daily before meals.  . [DISCONTINUED] rifaximin (XIFAXAN) 550 MG TABS tablet Take 1 tablet (550 mg total) by mouth 3 (three) times daily.  . [DISCONTINUED] traMADol (ULTRAM) 50 MG tablet Take 1 tablet (50 mg total) by mouth every 12 (twelve) hours as needed.   No facility-administered encounter medications on file as of 12/08/2018.    Allergies  Allergen Reactions  . Aspirin     hyperventilate  . Codeine     hyperventilates    Patient Active Problem List   Diagnosis Date Noted  . Ingrown toenail 08/16/2018  . Plantar fasciitis 07/20/2018  . Right lower quadrant abdominal pain 01/29/2015  . Upper abdominal pain 12/24/2014  . Constipation, chronic 11/05/2013  . 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 IBS (irritable bowel syndrome) 12/06/2012  . History of Helicobacter pylori infection 12/06/2012   Social History   Socioeconomic History  . Marital status: Married    Spouse name: Herbie Baltimore  . Number of children: 3  . Years of education: Not on file  . Highest education level: Not on file  Occupational History  . Occupation: unemployed  Social Needs  . Financial resource strain: Not on file  . Food insecurity    Worry: Not on file    Inability: Not on file  . Transportation needs    Medical: Not on file    Non-medical: Not on file  Tobacco Use  . Smoking status: Former Smoker    Packs/day: 0.50    Years: 25.00    Pack years: 12.50    Types: Cigarettes    Quit date: 06/02/2012    Years since quitting: 6.5  . Smokeless tobacco: Never Used  . Tobacco comment: 05-2012///Will use electronic cigarette   Substance and Sexual Activity  . Alcohol use: Yes    Comment: rare occ.  . Drug use: No  . Sexual activity: Yes    Birth control/protection: Surgical  Lifestyle  . Physical activity    Days per week: Not on file    Minutes per session: Not on file  . Stress: Not on file  Relationships  . Social Herbalist on phone: Not on file    Gets together: Not on file    Attends religious service: Not on file    Active member of club or organization: Not on file    Attends meetings of clubs or organizations: Not on file    Relationship status: Not on file  . Intimate partner violence    Fear of current or ex partner: Not on file    Emotionally abused: Not on file    Physically abused: Not on file    Forced sexual activity: Not on file  Other Topics Concern  . Not on file  Social  History Narrative  . Not on file    Traci Johnston's family history includes Breast cancer in her mother and sister; Prostate cancer in her father.      Objective:    Vitals:   12/08/18 1335  BP: 132/76  Pulse: 84  Temp: 98.4 F (36.9 C)    Physical Exam Well-developed well-nourished older African-American female in no acute distress.   BMI 33.4  HEENT; nontraumatic normocephalic, EOMI, PE R LA, sclera anicteric. Oropharynx; wearing mask/COVID Neck; supple, no JVD Cardiovascular; regular rate and rhythm with S1-S2, no murmur rub or gallop Pulmonary; Clear bilaterally Abdomen; soft, mild  bilateral lower quadrant tenderness, nondistended, no palpable mass or hepatosplenomegaly, bowel sounds are active Rectal; not done Skin; benign exam, no jaundice rash or appreciable lesions Extremities; no clubbing cyanosis or edema skin warm and dry Neuro/Psych; alert and oriented x4, grossly nonfocal mood and affect appropriate, affect flat       Assessment & Plan:   #32 58 year old African-American female with history of IBS D.  Patient had a viral illness in March 2020 had exacerbation of symptoms at that time has been having increased difficulty since. Colonoscopy 2014 with random biopsies showed no evidence of microscopic colitis.  Small bowel biopsies 2014 negative for celiac disease  Patient is now having 4-6 bowel movements per day despite taking Robinul Forte 2 mg twice daily, and using Zen Pep with meals She completed 12 days of Xifaxan for possible small bowel bacterial overgrowth with no change in symptoms actually felt worse on the antibiotic. Recent GI path panel negative and labs are reassuring thus done yesterday.  #2 status post cholecystectomy  #3.  History of hyperplastic colon polyp-up-to-date with colonoscopy last done 6/ 2014 #4 chronic anxiety/depression-she admits to increased stress recently may be exacerbating symptoms  Plan; start Librax 1 p.o. 3 times daily stop  Robinul Forte Continue Zenpep 3 p.o. AC meals Continue align 1 p.o. daily Start low FODMAP diet. Patient is asked to call back in a week with update, especially if symptoms or not improving. Amy Genia Harold PA-C 12/08/2018   Cc: Lin Landsman, MD   Addendum: Reviewed and agree with assessment and management plan. Pyrtle, Lajuan Lines, MD

## 2018-12-08 NOTE — Telephone Encounter (Signed)
Pt stated that Librax is $400 and that she cannot afford it.

## 2018-12-08 NOTE — Telephone Encounter (Signed)
Insurance will not cover Librax - cost out of pocket for patient $367.49.  Per Amy, patient can either try this or go back on Glycopyrrolate 2mg  bid every day and also try Xanax 1/2 of regular dose twice daily regularly for two weeks and let us know how that works.  Left message on patients voicemail.  Peter Congo, rma

## 2018-12-08 NOTE — Patient Instructions (Signed)
If you are age 57 or older, your body mass index should be between 23-30. Your Body mass index is 33.41 kg/m. If this is out of the aforementioned range listed, please consider follow up with your Primary Care Provider.  If you are age 68 or younger, your body mass index should be between 19-25. Your Body mass index is 33.41 kg/m. If this is out of the aformentioned range listed, please consider follow up with your Primary Care Provider.   We have sent the following medications to your pharmacy for you to pick up at your convenience: Librax  STOP Glycopyrrolate  Continue Zenpep 3 with meals.  You have been given a low fod-map diet.  Call with an update in a week.  Speak with Vaughan Basta (nurse.)  Thank you for choosing me and [facility].   Amy Esterwood, PA-C

## 2018-12-09 ENCOUNTER — Other Ambulatory Visit: Payer: Self-pay | Admitting: Internal Medicine

## 2018-12-11 NOTE — Telephone Encounter (Signed)
We can try continuing Robinul /glycopyrolate  2 mg po BID and add chlordiazepoxide 5mg  po tid ac which is the main component of librax - hopefully this will be much cheaper - please call in and let pt know- thanks  #90 /6

## 2018-12-12 MED ORDER — GLYCOPYRROLATE 2 MG PO TABS: 2.0000 mg | ORAL_TABLET | Freq: Two times a day (BID) | ORAL | 5 refills | Status: DC

## 2018-12-12 MED ORDER — CHLORDIAZEPOXIDE HCL 5 MG PO CAPS: 5.0000 mg | ORAL_CAPSULE | Freq: Three times a day (TID) | ORAL | 5 refills | Status: DC

## 2018-12-12 NOTE — Telephone Encounter (Signed)
We have sent medications to your pharmacy for pick. The patient has been notified of this information and all questions answered. The pt has been advised of the information and verbalized understanding.

## 2018-12-13 ENCOUNTER — Telehealth: Payer: Self-pay | Admitting: Physician Assistant

## 2018-12-13 NOTE — Telephone Encounter (Signed)
[facility] Pharmacist called regarding chlordiazepoxide.  [phone]

## 2019-02-15 ENCOUNTER — Other Ambulatory Visit: Payer: Self-pay | Admitting: Internal Medicine

## 2019-03-15 ENCOUNTER — Other Ambulatory Visit: Payer: Self-pay | Admitting: Internal Medicine

## 2019-05-16 ENCOUNTER — Other Ambulatory Visit: Payer: Self-pay | Admitting: Internal Medicine

## 2019-07-15 ENCOUNTER — Other Ambulatory Visit: Payer: Self-pay | Admitting: Internal Medicine

## 2019-07-17 ENCOUNTER — Ambulatory Visit: Payer: BC Managed Care – PPO | Admitting: Internal Medicine

## 2019-07-17 ENCOUNTER — Encounter: Payer: Self-pay | Admitting: Internal Medicine

## 2019-07-17 VITALS — BP 138/78 | HR 85 | Temp 97.0°F | Ht 65.5 in | Wt 207.0 lb

## 2019-07-17 DIAGNOSIS — R140 Abdominal distension (gaseous): Secondary | ICD-10-CM

## 2019-07-17 DIAGNOSIS — K582 Mixed irritable bowel syndrome: Secondary | ICD-10-CM

## 2019-07-17 MED ORDER — OMEPRAZOLE 20 MG PO CPDR: DELAYED_RELEASE_CAPSULE | ORAL | 3 refills | Status: DC

## 2019-07-17 MED ORDER — AMITRIPTYLINE HCL 50 MG PO TABS: ORAL_TABLET | ORAL | 5 refills | Status: DC

## 2019-07-17 MED ORDER — GLYCOPYRROLATE 2 MG PO TABS: 2.0000 mg | ORAL_TABLET | Freq: Two times a day (BID) | ORAL | 5 refills | Status: DC

## 2019-07-17 MED ORDER — DOXYCYCLINE HYCLATE 100 MG PO TBEC: 100.0000 mg | DELAYED_RELEASE_TABLET | Freq: Two times a day (BID) | ORAL | 0 refills | Status: DC

## 2019-07-17 NOTE — Patient Instructions (Addendum)
If you are age 58 or older, your body mass index should be between 23-30. Your Body mass index is 33.92 kg/m. If this is out of the aforementioned range listed, please consider follow up with your Primary Care Provider.  If you are age 46 or younger, your body mass index should be between 19-25. Your Body mass index is 33.92 kg/m. If this is out of the aformentioned range listed, please consider follow up with your Primary Care Provider.   We have sent the following medications to your pharmacy for you to pick up at your convenience: Amitrptyline 50 mg nightly.  Robinul 2 mg twice daily.  Omeprazole 20 mg twice daily.  Doxycyline 100 mg twice daily for 7 days.

## 2019-07-17 NOTE — Progress Notes (Signed)
   Subjective:    Patient ID: Traci Johnston, female    DOB: [birth year], 59 y.o.   MRN: [id number]  HPI Traci Johnston is a 59 year old female with a history of IBS with both diarrhea and constipation, history of gallstones status post cholecystectomy with postsurgical CBD dilatation, history of H. pylori status post treatment, colonic diverticulosis, anxiety and depression who is here for follow-up.  She was last seen in the office in June 2020.  She is here alone today.  When she was last here she saw Nicoletta Ba who prescribed Librax.  This was expensive and so she was given chlordiazepoxide to use with her Robinul Forte.  She is use this very intermittently.  She is using Robinul Forte 2 mg twice a day and Xanax 2-3 times a day for anxiety which she has found helpful.  Most recently she has had increased gas and bloating and some lower abdominal discomfort.  Her bowel movements have been mostly more formed and without the urgent diarrhea that she has had in the past.  She still eating just 1 meal a day between 5 and 6 PM.  She is using Creon intermittently.  She is continuing her daily probiotic, Align.  She continues omeprazole 20 mg twice a day.  No blood in her stool or melena.  She avoids fried foods.  She has had significant stress recently with her husband's medical condition.  He has been hospitalized recently.  Review of Systems As per HPI, otherwise negative  Current Medications, Allergies, Past Medical History, Past Surgical History, Family History and Social History were reviewed in [employer] record.     Objective:   Physical Exam BP 138/78   Pulse 85   Temp (!) 97 F (36.1 C)   Ht 5' 5.5" (1.664 m)   Wt 207 lb (93.9 kg)   BMI 33.92 kg/m  Gen: awake, alert, NAD HEENT: anicteric, op clear CV: RRR, no mrg Pulm: CTA b/l Abd: soft, NT/ND, +BS throughout Ext: no c/c/e Neuro: nonfocal     Assessment & Plan:  59 year old female with a history of IBS  with both diarrhea and constipation, history of gallstones status post cholecystectomy with postsurgical CBD dilatation, history of H. pylori status post treatment, colonic diverticulosis, anxiety and depression who is here for follow-up.  1.  IBS --history of alternating diarrhea and constipation, gas and bloating which may be an element of bacterial overgrowth.  She has been using combination of antispasmodic and benzodiazepine.  Her Xanax is prescribed by another provider but does very likely benefit her IBS as Librax would.  I reminded her not to use chlordiazepoxide with alprazolam and she has not been doing so.  We will: --Continue omeprazole 20 mg twice daily AC --Continue glycopyrrolate 2 mg every 12 hours --She can use alprazolam 1 mg 3 times daily as needed for anxiety but this will also help IBS --She can continue Zenpep on an as-needed basis --Continue amitriptyline at bedtime 50 mg --She is using daily probiotic --Doxycycline 100 mg twice daily x7 days for empiric SIBO therapy  She request to follow-up in 3 months  30 minutes total spent today including patient facing time, coordination of care, reviewing medical history/procedures/pertinent radiology studies, and documentation of the encounter.

## 2019-10-04 ENCOUNTER — Other Ambulatory Visit: Payer: Self-pay | Admitting: Physician Assistant

## 2019-10-16 ENCOUNTER — Ambulatory Visit: Payer: BC Managed Care – PPO | Admitting: Internal Medicine

## 2019-11-13 ENCOUNTER — Ambulatory Visit: Payer: BC Managed Care – PPO | Admitting: Physician Assistant

## 2019-12-06 ENCOUNTER — Other Ambulatory Visit: Payer: Self-pay | Admitting: *Deleted

## 2019-12-06 MED ORDER — OMEPRAZOLE 20 MG PO CPDR: DELAYED_RELEASE_CAPSULE | ORAL | 3 refills | Status: DC

## 2019-12-06 NOTE — Progress Notes (Signed)
Sent in refills of omeprazole per fax request

## 2019-12-13 ENCOUNTER — Telehealth: Payer: Self-pay | Admitting: Internal Medicine

## 2019-12-13 ENCOUNTER — Other Ambulatory Visit: Payer: Self-pay | Admitting: Physician Assistant

## 2019-12-13 NOTE — Telephone Encounter (Signed)
Called and spoke to pt. We received a refill for Librium but Dr. Vena Rua note said that she should not be taking this with Xanax.  Patient confirmed she is still taking Xanax so I refused the refill request.  Pt needs an appt with Pyrtle but he has NO openings. Patient requested an appt with Amy Esterwood. Amy also has NO appts. I offered the pt an earlier appt with another APP but she declined.  Please call and schedule pt for an appt with Pyrtle when he has more appts open [place]. Or with A. Esterwood. Thanks

## 2019-12-13 NOTE — Telephone Encounter (Signed)
Pt scheduled to see Dr. Hilarie Fredrickson 12/14/19@10 :10am. Pt aware of appt.

## 2019-12-13 NOTE — Telephone Encounter (Signed)
Patient is calling states she has abd pain please advise

## 2019-12-14 ENCOUNTER — Ambulatory Visit (INDEPENDENT_AMBULATORY_CARE_PROVIDER_SITE_OTHER): Payer: Self-pay | Admitting: Internal Medicine

## 2019-12-14 ENCOUNTER — Ambulatory Visit (INDEPENDENT_AMBULATORY_CARE_PROVIDER_SITE_OTHER)
Admission: RE | Admit: 2019-12-14 | Discharge: 2019-12-14 | Disposition: A | Discharge status: Home / Self Care | Payer: BLUE CROSS/BLUE SHIELD | Source: Ambulatory Visit | Attending: Internal Medicine | Admitting: Internal Medicine

## 2019-12-14 ENCOUNTER — Other Ambulatory Visit: Payer: Self-pay

## 2019-12-14 ENCOUNTER — Encounter: Payer: Self-pay | Admitting: Internal Medicine

## 2019-12-14 ENCOUNTER — Other Ambulatory Visit (INDEPENDENT_AMBULATORY_CARE_PROVIDER_SITE_OTHER): Payer: BLUE CROSS/BLUE SHIELD

## 2019-12-14 VITALS — BP 122/80 | HR 72 | Ht 65.25 in | Wt 214.1 lb

## 2019-12-14 DIAGNOSIS — K589 Irritable bowel syndrome without diarrhea: Secondary | ICD-10-CM

## 2019-12-14 DIAGNOSIS — R1084 Generalized abdominal pain: Secondary | ICD-10-CM

## 2019-12-14 DIAGNOSIS — R197 Diarrhea, unspecified: Secondary | ICD-10-CM | POA: Diagnosis not present

## 2019-12-14 LAB — COMPREHENSIVE METABOLIC PANEL
ALT: 14 U/L (ref 0–35)
AST: 14 U/L (ref 0–37)
Albumin: 4.4 g/dL (ref 3.5–5.2)
Alkaline Phosphatase: 129 U/L — ABNORMAL HIGH (ref 39–117)
BUN: 15 mg/dL (ref 6–23)
CO2: 29 mEq/L (ref 19–32)
Calcium: 9.8 mg/dL (ref 8.4–10.5)
Chloride: 101 mEq/L (ref 96–112)
Creatinine, Ser: 1.15 mg/dL (ref 0.40–1.20)
GFR: 58.72 mL/min — ABNORMAL LOW (ref >60.00)
Glucose, Bld: 100 mg/dL — ABNORMAL HIGH (ref 70–99)
Potassium: 4.3 mEq/L (ref 3.5–5.1)
Sodium: 138 mEq/L (ref 135–145)
Total Bilirubin: 0.3 mg/dL (ref 0.2–1.2)
Total Protein: 7.3 g/dL (ref 6.0–8.3)

## 2019-12-14 LAB — CBC WITH DIFFERENTIAL/PLATELET
Basophils Absolute: 0 10*3/uL (ref 0.0–0.1)
Basophils Relative: 0.5 % (ref 0.0–3.0)
Eosinophils Absolute: 0.1 10*3/uL (ref 0.0–0.7)
Eosinophils Relative: 1.5 % (ref 0.0–5.0)
HCT: 38.9 % (ref 36.0–46.0)
Hemoglobin: 12.5 g/dL (ref 12.0–15.0)
Lymphocytes Relative: 43.7 % (ref 12.0–46.0)
Lymphs Abs: 2.6 10*3/uL (ref 0.7–4.0)
MCHC: 32 g/dL (ref 30.0–36.0)
MCV: 86.4 fl (ref 78.0–100.0)
Monocytes Absolute: 0.4 10*3/uL (ref 0.1–1.0)
Monocytes Relative: 6.6 % (ref 3.0–12.0)
Neutro Abs: 2.8 10*3/uL (ref 1.4–7.7)
Neutrophils Relative %: 47.7 % (ref 43.0–77.0)
Platelets: 306 10*3/uL (ref 150.0–400.0)
RBC: 4.5 Mil/uL (ref 3.87–5.11)
RDW: 13.5 % (ref 11.5–15.5)
WBC: 5.9 10*3/uL (ref 4.0–10.5)

## 2019-12-14 MED ORDER — SUTAB 1479-225-188 MG PO TABS: 1.0000 | ORAL_TABLET | ORAL | 0 refills | Status: DC

## 2019-12-14 NOTE — Patient Instructions (Addendum)
You have been scheduled for an endoscopy and colonoscopy. Please follow the written instructions given to you at your visit today. Please pick up your prep supplies at the pharmacy within the next 1-3 days. If you use inhalers (even only as needed), please bring them with you on the day of your procedure. Your physician has requested that you go to www.startemmi.com and enter the access code given to you at your visit today. This web site gives a general overview about your procedure. However, you should still follow specific instructions given to you by our office regarding your preparation for the procedure. ________________________________________ Your provider has requested that you have an abdominal x ray before leaving today. Please go to the basement floor to our Radiology department for the test. _______________________________________ Your provider has requested that you go to the basement level for lab work before leaving today. Press "B" on the elevator. The lab is located at the first door on the left as you exit the elevator. _______________________________________ Continue omeprazole. _______________________________________ Take your glycopyrrolate WITH chlordiazepoxide twice daily. This should be taken SEPARATE from Xanax. _______________________________________ Use Xanax only at night for sleep.  ________________________________________  If you are age 58 or older, your body mass index should be between 23-30. Your Body mass index is 35.36 kg/m. If this is out of the aforementioned range listed, please consider follow up with your Primary Care Provider.  If you are age 18 or younger, your body mass index should be between 19-25. Your Body mass index is 35.36 kg/m. If this is out of the aformentioned range listed, please consider follow up with your Primary Care Provider.   Due to recent changes in healthcare laws, you may see the results of your imaging and laboratory studies on  MyChart before your provider has had a chance to review them.  We understand that in some cases there may be results that are confusing or concerning to you. Not all laboratory results come back in the same time frame and the provider may be waiting for multiple results in order to interpret others.  Please give [place] 48 hours in order for your provider to thoroughly review all the results before contacting the office for clarification of your results.

## 2019-12-14 NOTE — Progress Notes (Signed)
Subjective:    Patient ID: Traci Johnston, female    DOB: 1960-10-17, 59 y.o.   MRN: [id number]  HPI Traci Johnston is a 59 year old female with a history of IBS with both diarrhea and constipation, prior gallstones status post cholecystectomy with postsurgical CBD dilatation, history of H. pylori status post treatment, colonic diverticulosis, anxiety and depression who is here for follow-up.  She was last seen in the office in January 2021.  She is here alone today.  She called because she has been having increased abdominal pain and diarrhea.  An urgent office visit was scheduled.  She reports that earlier this week she developed intense diarrhea and lower abdominal spasm that lasted 8 to 10 hours.  She had a hard time even getting off the toilet.  Stool was not bloody or melenic.  Lomotil was not helpful.  This has been happening intermittently and she has both upper abdominal pain as well as lower abdominal pain at times.  Some nausea without vomiting.  She has been using omeprazole 20 mg twice a day, she is using glycopyrrolate 2 mg twice a day, she is only using chlordiazepoxide at night but also taking 0.5 mg of Xanax at night to help with sleep.  She is continued the amitriptyline 50 mg as well as her probiotic.  We treated her earlier this year with doxycycline for possible SIBO she cannot recall if this helped.  We did a CT scan of the abdomen pelvis in January 2020 which showed nothing acute  Her last endoscopic evaluations were April 2015  Review of Systems As per HPI, otherwise negative  Current Medications, Allergies, Past Medical History, Past Surgical History, Family History and Social History were reviewed in [employer] record.     Objective:   Physical Exam BP 122/80 (BP Location: Left Arm, Patient Position: Sitting, Cuff Size: Normal)   Pulse 72   Ht 5' 5.25" (1.657 m) Comment: height measured without shoes  Wt 214 lb 2 oz (97.1 kg)   BMI 35.36 kg/m    Gen: awake, alert, NAD HEENT: anicteric CV: RRR, no mrg Pulm: CTA b/l Abd: soft, diffusely tender without rebound or guarding, mildly distended, +BS throughout Ext: no c/c/e Neuro: nonfocal     Assessment & Plan:  59 year old female with a history of IBS with both diarrhea and constipation, prior gallstones status post cholecystectomy with postsurgical CBD dilatation, history of H. pylori status post treatment, colonic diverticulosis, anxiety and depression who is here for follow-up.  1.  IBS with alternating diarrhea and constipation --exacerbation of probable irritable bowel with upper abdominal pain, diarrhea.  Most of the symptoms have fluctuated over time.  She is concerned that there may be something else going on.  I think it is reasonable given that has been over 6 years since endoscopic evaluation to repeat upper endoscopy and colonoscopy for further evaluation.  We again discussed the use of Librax which we have split into components given expense.  This medication has definitively helped her but she needs to separate chlordiazepoxide from alprazolam as follows: --Continue omeprazole 20 mg twice daily AC --Continue glycopyrrolate 2 mg plus chlordiazepoxide 5 mg every 12 hours --If she is using alprazolam 0.5 to 1 mg this should only be nightly as needed for sleep per her prescribing provider --Upper endoscopy and colonoscopy in the [place] --2 view abdominal x-ray today  30 minutes total spent today including patient facing time, coordination of care, reviewing medical history/procedures/pertinent radiology studies, and documentation of  the encounter.

## 2019-12-20 ENCOUNTER — Encounter: Payer: Self-pay | Admitting: Internal Medicine

## 2019-12-25 ENCOUNTER — Ambulatory Visit (AMBULATORY_SURGERY_CENTER): Payer: BLUE CROSS/BLUE SHIELD | Admitting: Internal Medicine

## 2019-12-25 ENCOUNTER — Encounter: Payer: Self-pay | Admitting: Internal Medicine

## 2019-12-25 ENCOUNTER — Other Ambulatory Visit: Payer: Self-pay

## 2019-12-25 VITALS — BP 110/69 | HR 63 | Temp 97.4°F | Resp 15 | Ht 65.25 in | Wt 214.0 lb

## 2019-12-25 DIAGNOSIS — K2970 Gastritis, unspecified, without bleeding: Secondary | ICD-10-CM | POA: Diagnosis not present

## 2019-12-25 DIAGNOSIS — K317 Polyp of stomach and duodenum: Secondary | ICD-10-CM

## 2019-12-25 DIAGNOSIS — B9681 Helicobacter pylori [H. pylori] as the cause of diseases classified elsewhere: Secondary | ICD-10-CM | POA: Diagnosis not present

## 2019-12-25 DIAGNOSIS — R1084 Generalized abdominal pain: Secondary | ICD-10-CM

## 2019-12-25 DIAGNOSIS — K589 Irritable bowel syndrome without diarrhea: Secondary | ICD-10-CM

## 2019-12-25 DIAGNOSIS — R194 Change in bowel habit: Secondary | ICD-10-CM

## 2019-12-25 DIAGNOSIS — K2950 Unspecified chronic gastritis without bleeding: Secondary | ICD-10-CM

## 2019-12-25 DIAGNOSIS — R198 Other specified symptoms and signs involving the digestive system and abdomen: Secondary | ICD-10-CM

## 2019-12-25 DIAGNOSIS — K3189 Other diseases of stomach and duodenum: Secondary | ICD-10-CM

## 2019-12-25 MED ORDER — SODIUM CHLORIDE 0.9 % IV SOLN: 500.0000 mL | Freq: Once | INTRAVENOUS | Status: DC

## 2019-12-25 NOTE — Op Note (Signed)
Traci Johnston Patient Name: Traci Johnston Procedure Date: 12/25/2019 8:36 AM MRN: [id number] Endoscopist: Jerene Bears , MD Age: 59 Referring MD:  Date of Birth: May 30, 1961 Gender: Female Account #: [id number] Procedure:                Upper GI endoscopy Indications:              Generalized abdominal pain, alternating bowel habits Medicines:                Monitored Anesthesia Care Procedure:                Pre-Anesthesia Assessment:                           - Prior to the procedure, a History and Physical                            was performed, and patient medications and                            allergies were reviewed. The patient's tolerance of                            previous anesthesia was also reviewed. The risks                            and benefits of the procedure and the sedation                            options and risks were discussed with the patient.                            All questions were answered, and informed consent                            was obtained. Prior Anticoagulants: The patient has                            taken no previous anticoagulant or antiplatelet                            agents. ASA Grade Assessment: II - A patient with                            mild systemic disease. After reviewing the risks                            and benefits, the patient was deemed in                            satisfactory condition to undergo the procedure.                           After obtaining informed consent, the endoscope was  passed under direct vision. Throughout the                            procedure, the patient's blood pressure, pulse, and                            oxygen saturations were monitored continuously. The                            Endoscope was introduced through the mouth, and                            advanced to the second part of duodenum. The upper                            GI  endoscopy was accomplished without difficulty.                            The patient tolerated the procedure well. Scope In: Scope Out: Findings:                 The examined esophagus was normal.                           A single 8 mm sessile polyp/nodule was found in the                            gastric antrum. This appeared more submucosal than                            mucosal in nature. Biopsies (tunneled) were taken                            with a cold forceps for histology.                           Moderate inflammation characterized by congestion                            (edema), erythema and granularity was found in the                            distal gastric body and antrum. Biopsies were taken                            with a cold forceps for histology and Helicobacter                            pylori testing.                           The examined duodenum was normal. Biopsies were                            taken with  a cold forceps for histology. Complications:            No immediate complications. Estimated Blood Loss:     Estimated blood loss was minimal. Impression:               - Normal esophagus.                           - A single gastric nodule. Biopsied.                           - Gastritis. Biopsied.                           - Normal examined duodenum. Biopsied. Recommendation:           - Patient has a contact number available for                            emergencies. The signs and symptoms of potential                            delayed complications were discussed with the                            patient. Return to normal activities tomorrow.                            Written discharge instructions were provided to the                            patient.                           - Resume previous diet.                           - Continue present medications.                           - Await pathology results. Jerene Bears,  MD 12/25/2019 9:24:10 AM This report has been signed electronically.

## 2019-12-25 NOTE — Progress Notes (Signed)
Called to room to assist during endoscopic procedure.  Patient ID and intended procedure confirmed with present staff. Received instructions for my participation in the procedure from the performing physician.  

## 2019-12-25 NOTE — Patient Instructions (Signed)
Handouts given:  Gastritis Resume previous diet Continue present medications Await pathology results  YOU HAD AN ENDOSCOPIC PROCEDURE TODAY AT [facility]:   Refer to the procedure report that was given to you for any specific questions about what was found during the examination.  If the procedure report does not answer your questions, please call your gastroenterologist to clarify.  If you requested that your care partner not be given the details of your procedure findings, then the procedure report has been included in a sealed envelope for you to review at your convenience later.  YOU SHOULD EXPECT: Some feelings of bloating in the abdomen. Passage of more gas than usual.  Walking can help get rid of the air that was put into your GI tract during the procedure and reduce the bloating. If you had a lower endoscopy (such as a colonoscopy or flexible sigmoidoscopy) you may notice spotting of blood in your stool or on the toilet paper. If you underwent a bowel prep for your procedure, you may not have a normal bowel movement for a few days.  Please Note:  You might notice some irritation and congestion in your nose or some drainage.  This is from the oxygen used during your procedure.  There is no need for concern and it should clear up in a day or so.  SYMPTOMS TO REPORT IMMEDIATELY:   Following lower endoscopy (colonoscopy or flexible sigmoidoscopy):  Excessive amounts of blood in the stool  Significant tenderness or worsening of abdominal pains  Swelling of the abdomen that is new, acute  Fever of 100F or higher   Following upper endoscopy (EGD)  Vomiting of blood or coffee ground material  New chest pain or pain under the shoulder blades  Painful or persistently difficult swallowing  New shortness of breath  Fever of 100F or higher  Black, tarry-looking stools  For urgent or emergent issues, a gastroenterologist can be reached at any hour by calling [phone]  [phone]. Do not use MyChart messaging for urgent concerns.    DIET:  We do recommend a small meal at first, but then you may proceed to your regular diet.  Drink plenty of fluids but you should avoid alcoholic beverages for 24 hours.  ACTIVITY:  You should plan to take it easy for the rest of today and you should NOT DRIVE or use heavy machinery until tomorrow (because of the sedation medicines used during the test).    FOLLOW UP: Our staff will call the number listed on your records 48-72 hours following your procedure to check on you and address any questions or concerns that you may have regarding the information given to you following your procedure. If we do not reach you, we will leave a message.  We will attempt to reach you two times.  During this call, we will ask if you have developed any symptoms of COVID 19. If you develop any symptoms (ie: fever, flu-like symptoms, shortness of breath, cough etc.) before then, please call [phone].  If you test positive for Covid 19 in the 2 weeks post procedure, please call and report this information to [place].    If any biopsies were taken you will be contacted by phone or by letter within the next 1-3 weeks.  Please call us at [phone] if you have not heard about the biopsies in 3 weeks.    SIGNATURES/CONFIDENTIALITY: You and/or your care partner have signed paperwork which will be entered into your electronic medical  record.  These signatures attest to the fact that that the information above on your After Visit Summary has been reviewed and is understood.  Full responsibility of the confidentiality of this discharge information lies with you and/or your care-partner. 

## 2019-12-25 NOTE — Progress Notes (Signed)
pt tolerated well. VSS. awake and to recovery. Report given to RN. Bite block inserted and removed without trauma. 

## 2019-12-25 NOTE — Op Note (Signed)
[name] Procedure Date: 12/25/2019 8:36 AM MRN: [id number] Endoscopist: Jerene Bears , MD Age: 59 Referring MD:  Date of Birth: 10-28-1960 Gender: Female Account #: [id number] Procedure:                Colonoscopy Indications:              Generalized abdominal pain, alternating diarrhea                            and constipation, history of IBS, colonoscopies in                            2014 and 2015 Medicines:                Monitored Anesthesia Care Procedure:                Pre-Anesthesia Assessment:                           - Prior to the procedure, a History and Physical                            was performed, and patient medications and                            allergies were reviewed. The patient's tolerance of                            previous anesthesia was also reviewed. The risks                            and benefits of the procedure and the sedation                            options and risks were discussed with the patient.                            All questions were answered, and informed consent                            was obtained. Prior Anticoagulants: The patient has                            taken no previous anticoagulant or antiplatelet                            agents. ASA Grade Assessment: II - A patient with                            mild systemic disease. After reviewing the risks                            and benefits, the patient was deemed in  satisfactory condition to undergo the procedure.                           After obtaining informed consent, the colonoscope                            was passed under direct vision. Throughout the                            procedure, the patient's blood pressure, pulse, and                            oxygen saturations were monitored continuously. The                            Colonoscope was introduced through the anus and                             advanced to the terminal ileum. The colonoscopy was                            performed without difficulty. The patient tolerated                            the procedure well. The quality of the bowel                            preparation was fair clearing to adequate after                            copious irrigation and lavage. The terminal ileum,                            ileocecal valve, appendiceal orifice, and rectum                            were photographed. Scope In: 8:53:40 AM Scope Out: 9:16:13 AM Scope Withdrawal Time: 0 hours 16 minutes 45 seconds  Total Procedure Duration: 0 hours 22 minutes 33 seconds  Findings:                 The terminal ileum appeared normal.                           The colon (entire examined portion) was redundant.                           The colon (entire examined portion) appeared                            normal. Biopsies for histology were taken with a                            cold forceps from the right colon and left colon  for evaluation of microscopic colitis.                           Internal hemorrhoids were found on direct views.                            Retroflexion in the rectum was not performed today                            due to narrow rectal vault. The hemorrhoids were                            small. Complications:            No immediate complications. Estimated Blood Loss:     Estimated blood loss was minimal. Impression:               - The examined portion of the ileum was normal.                           - The entire examined colon is normal. Biopsied.                           - Small internal hemorrhoids. Recommendation:           - Patient has a contact number available for                            emergencies. The signs and symptoms of potential                            delayed complications were discussed with the                             patient. Return to normal activities tomorrow.                            Written discharge instructions were provided to the                            patient.                           - Resume previous diet.                           - Continue present medications.                           - Await pathology results.                           - Suspicion for constipation predominant IBS with                            overflow diarrhea. Will discuss treatment options  based on pathology results.                           - Repeat colonoscopy is recommended. The                            colonoscopy date will be determined after pathology                            results from today's exam become available for                            review. Jerene Bears, MD 12/25/2019 9:29:58 AM This report has been signed electronically.

## 2019-12-25 NOTE — Progress Notes (Signed)
VS by CW  Pt's states no medical or surgical changes since previsit or office visit.  

## 2019-12-27 ENCOUNTER — Other Ambulatory Visit: Payer: Self-pay

## 2019-12-27 ENCOUNTER — Telehealth: Payer: Self-pay

## 2019-12-27 DIAGNOSIS — K2970 Gastritis, unspecified, without bleeding: Secondary | ICD-10-CM

## 2019-12-27 MED ORDER — CLARITHROMYCIN 500 MG PO TABS: 500.0000 mg | ORAL_TABLET | Freq: Two times a day (BID) | ORAL | 0 refills | Status: AC

## 2019-12-27 MED ORDER — AMOXICILLIN 500 MG PO CAPS: 1000.0000 mg | ORAL_CAPSULE | Freq: Two times a day (BID) | ORAL | 0 refills | Status: AC

## 2019-12-27 NOTE — Telephone Encounter (Signed)
Left message on answering machine. 

## 2020-01-02 ENCOUNTER — Telehealth: Payer: Self-pay

## 2020-01-02 NOTE — Telephone Encounter (Signed)
Pt states she has only had 1 BM this am and no gas. Discussed with pt that she could take miralax 1-3 doses to have BM. Pt knows to call back with any problems.

## 2020-01-04 ENCOUNTER — Other Ambulatory Visit: Payer: Self-pay | Admitting: Physician Assistant

## 2020-02-21 ENCOUNTER — Telehealth: Payer: Self-pay

## 2020-02-21 NOTE — Telephone Encounter (Signed)
Spoke with patient to remind her that she is due for a repeat stool test to make sure H. Pylori has been eradicated, I asked patient had she stopped her Omeprazole for 2 weeks - she stated yes. Pt advised to come in at her convenience between 7:30-5, Monday through Friday. Advised that we are closed on Monday due to the holiday, pt states that she will come in Tuesday to pick up specimen container.

## 2020-02-21 NOTE — Telephone Encounter (Signed)
-----   Message from Yevette Edwards, RN sent at 12/27/2019 11:56 AM EDT ----- Regarding: Labs H. Pylori stool antigen, order in epic

## 2020-03-03 ENCOUNTER — Other Ambulatory Visit: Payer: Self-pay | Admitting: Internal Medicine

## 2020-05-02 ENCOUNTER — Other Ambulatory Visit: Payer: Self-pay | Admitting: Internal Medicine

## 2020-05-27 ENCOUNTER — Other Ambulatory Visit: Payer: Self-pay | Admitting: Internal Medicine

## 2020-05-27 DIAGNOSIS — B9681 Helicobacter pylori [H. pylori] as the cause of diseases classified elsewhere: Secondary | ICD-10-CM

## 2020-05-27 DIAGNOSIS — K2970 Gastritis, unspecified, without bleeding: Secondary | ICD-10-CM

## 2020-07-11 ENCOUNTER — Other Ambulatory Visit: Payer: Self-pay | Admitting: Physician Assistant

## 2020-07-28 ENCOUNTER — Other Ambulatory Visit: Payer: Self-pay | Admitting: Physician Assistant

## 2020-08-19 ENCOUNTER — Other Ambulatory Visit: Payer: Self-pay | Admitting: Internal Medicine

## 2020-09-07 ENCOUNTER — Other Ambulatory Visit: Payer: Self-pay | Admitting: Internal Medicine

## 2020-09-08 NOTE — Telephone Encounter (Signed)
She is likely using this more on an as-needed basis, okay to refill and I will discuss with her at next routine follow-up

## 2020-09-08 NOTE — Telephone Encounter (Signed)
Dr Hilarie Fredrickson- I have a request for patient to have amitriptyline.... according to our records, if taken correctly, she should have been out of med 05/2020. This seems to have been a pattern. Do you want me to go ahead and fill again or do you want to talk to her about this at her next office visit (not yet scheduled)?

## 2020-12-09 ENCOUNTER — Other Ambulatory Visit: Payer: Self-pay | Admitting: Internal Medicine

## 2020-12-31 ENCOUNTER — Other Ambulatory Visit: Payer: Self-pay | Admitting: Physician Assistant

## 2021-01-21 ENCOUNTER — Other Ambulatory Visit: Payer: Self-pay | Admitting: Internal Medicine

## 2021-01-24 IMAGING — DX DG ABDOMEN 2V
2 series · 2 of 2 positions shown · non-contrast
Comparison: December 21, 2014

CLINICAL DATA: Generalized abdominal pain

EXAM:
ABDOMEN - 2 VIEW

[abdomen erect]
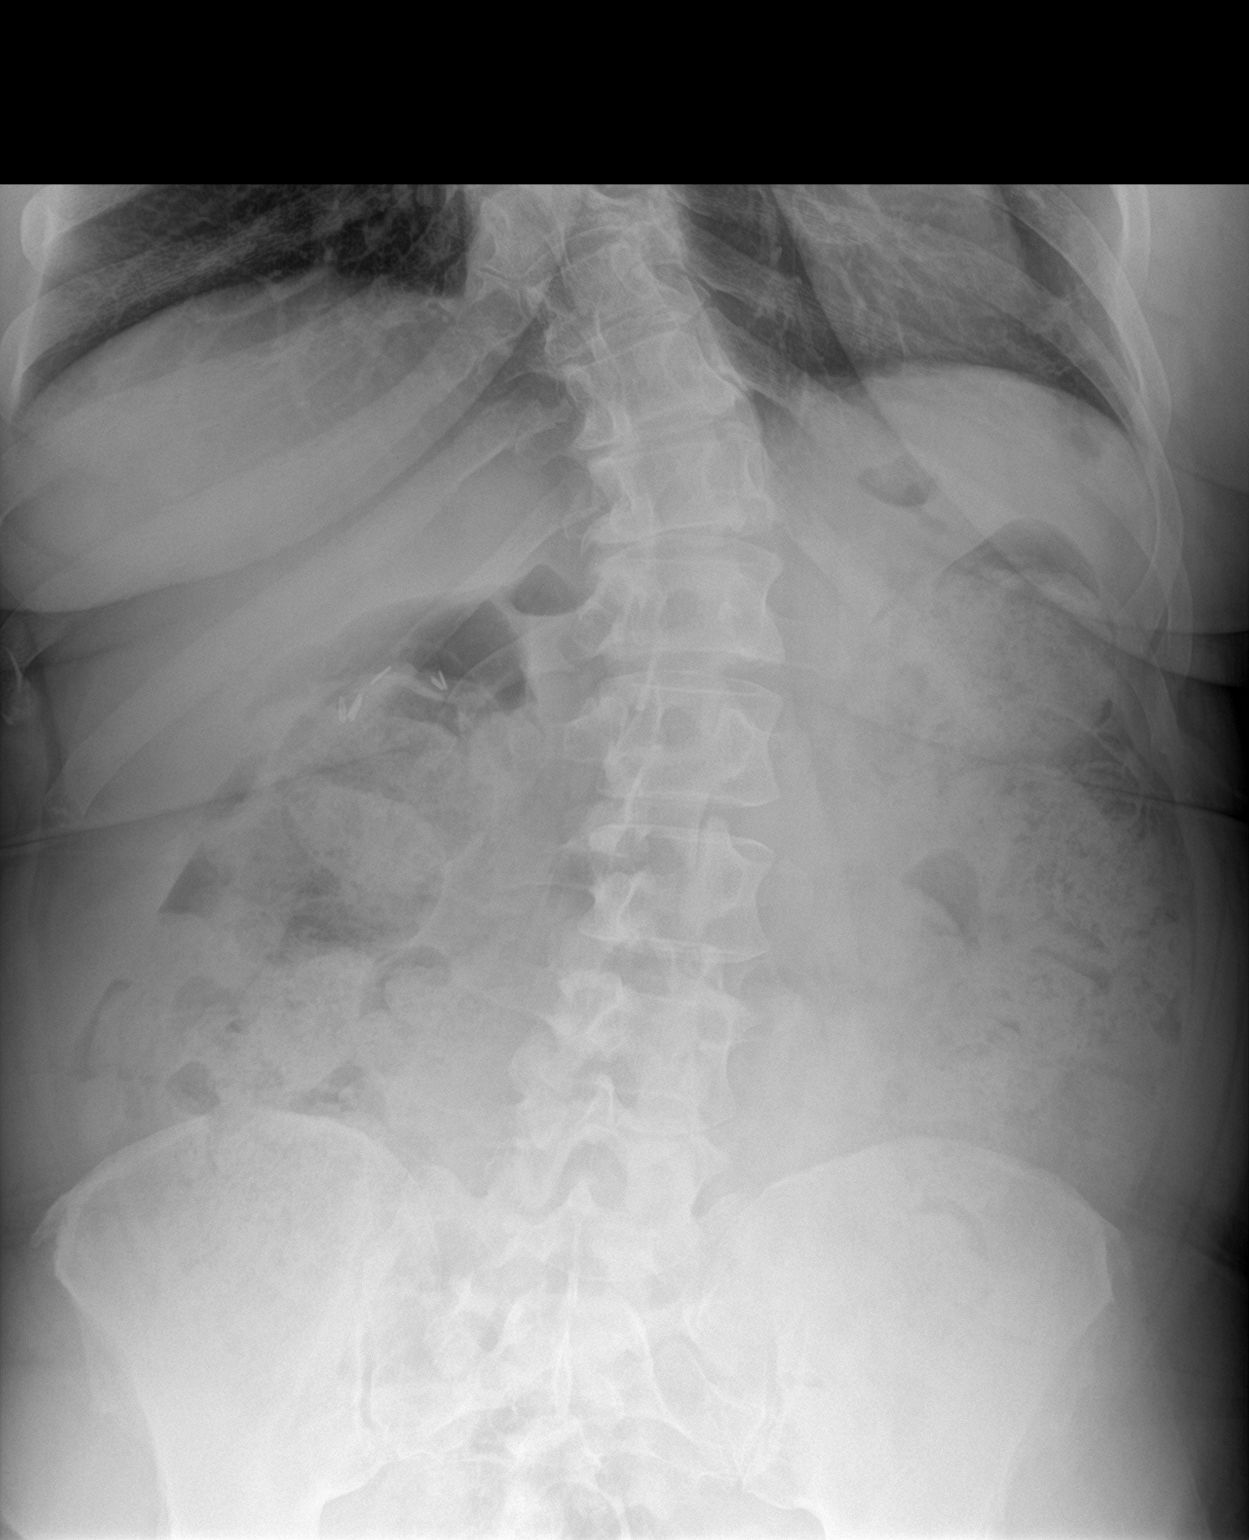

[abdomen supine]
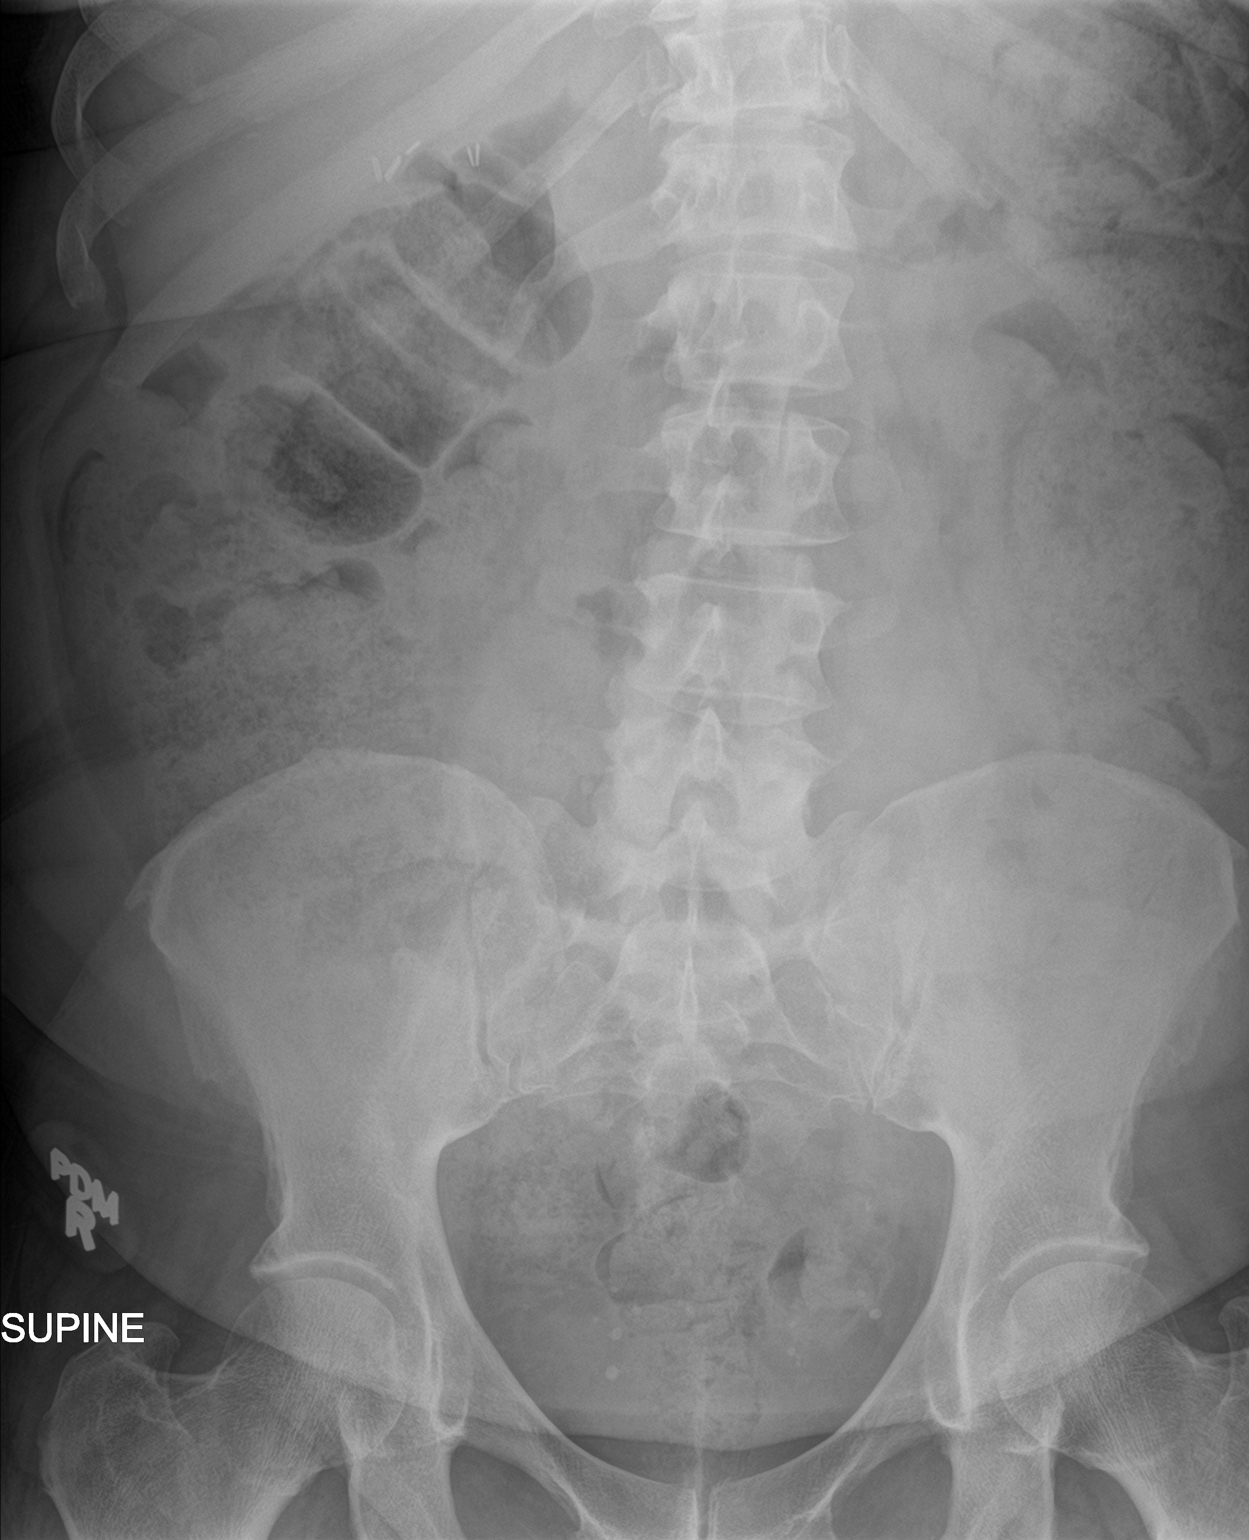

[2 of 2 positions shown; findings below may reference images not displayed]

FINDINGS: The bowel gas pattern is normal. There is no evidence of free air.
No radio-opaque calculi or other significant radiographic
abnormality is seen. There is a large amount of stool in the colon.
IMPRESSION: 1. Nonobstructive bowel gas pattern.
2. Large amount of stool in the colon.

## 2021-05-08 ENCOUNTER — Other Ambulatory Visit: Payer: Self-pay | Admitting: Internal Medicine

## 2021-06-08 ENCOUNTER — Other Ambulatory Visit: Payer: Self-pay | Admitting: Internal Medicine

## 2021-09-05 ENCOUNTER — Other Ambulatory Visit: Payer: Self-pay | Admitting: Internal Medicine

## 2021-10-05 ENCOUNTER — Other Ambulatory Visit: Payer: Self-pay | Admitting: Internal Medicine

## 2022-03-17 ENCOUNTER — Other Ambulatory Visit: Payer: Self-pay | Admitting: Internal Medicine

## 2022-05-20 ENCOUNTER — Ambulatory Visit: Payer: BLUE CROSS/BLUE SHIELD | Admitting: Physician Assistant

## 2022-07-02 ENCOUNTER — Encounter (HOSPITAL_BASED_OUTPATIENT_CLINIC_OR_DEPARTMENT_OTHER): Payer: Self-pay

## 2022-07-02 ENCOUNTER — Emergency Department (HOSPITAL_BASED_OUTPATIENT_CLINIC_OR_DEPARTMENT_OTHER)
Admission: EM | Admit: 2022-07-02 | Discharge: 2022-07-02 | Disposition: A | Discharge status: Home / Self Care | Payer: BLUE CROSS/BLUE SHIELD | Attending: Emergency Medicine | Admitting: Emergency Medicine

## 2022-07-02 ENCOUNTER — Other Ambulatory Visit: Payer: Self-pay

## 2022-07-02 ENCOUNTER — Emergency Department (HOSPITAL_BASED_OUTPATIENT_CLINIC_OR_DEPARTMENT_OTHER): Payer: BLUE CROSS/BLUE SHIELD

## 2022-07-02 DIAGNOSIS — R197 Diarrhea, unspecified: Secondary | ICD-10-CM | POA: Diagnosis not present

## 2022-07-02 DIAGNOSIS — Z1152 Encounter for screening for COVID-19: Secondary | ICD-10-CM | POA: Insufficient documentation

## 2022-07-02 DIAGNOSIS — R1013 Epigastric pain: Secondary | ICD-10-CM | POA: Diagnosis not present

## 2022-07-02 DIAGNOSIS — R109 Unspecified abdominal pain: Secondary | ICD-10-CM | POA: Diagnosis present

## 2022-07-02 DIAGNOSIS — R112 Nausea with vomiting, unspecified: Secondary | ICD-10-CM | POA: Diagnosis not present

## 2022-07-02 DIAGNOSIS — K529 Noninfective gastroenteritis and colitis, unspecified: Secondary | ICD-10-CM

## 2022-07-02 LAB — CBC WITH DIFFERENTIAL/PLATELET
Abs Immature Granulocytes: 0.05 10*3/uL (ref 0.00–0.07)
Basophils Absolute: 0 10*3/uL (ref 0.0–0.1)
Basophils Relative: 0 %
Eosinophils Absolute: 0 10*3/uL (ref 0.0–0.5)
Eosinophils Relative: 0 %
HCT: 44.5 % (ref 36.0–46.0)
Hemoglobin: 13.8 g/dL (ref 12.0–15.0)
Immature Granulocytes: 0 %
Lymphocytes Relative: 6 %
Lymphs Abs: 0.7 10*3/uL (ref 0.7–4.0)
MCH: 27.4 pg (ref 26.0–34.0)
MCHC: 31 g/dL (ref 30.0–36.0)
MCV: 88.3 fL (ref 80.0–100.0)
Monocytes Absolute: 0.3 10*3/uL (ref 0.1–1.0)
Monocytes Relative: 3 %
Neutro Abs: 10.9 10*3/uL — ABNORMAL HIGH (ref 1.7–7.7)
Neutrophils Relative %: 91 %
Platelets: 239 10*3/uL (ref 150–400)
RBC: 5.04 MIL/uL (ref 3.87–5.11)
RDW: 14.2 % (ref 11.5–15.5)
WBC: 11.9 10*3/uL — ABNORMAL HIGH (ref 4.0–10.5)
nRBC: 0 % (ref 0.0–0.2)

## 2022-07-02 LAB — URINALYSIS, MICROSCOPIC (REFLEX): WBC, UA: NONE SEEN WBC/hpf (ref 0–5)

## 2022-07-02 LAB — COMPREHENSIVE METABOLIC PANEL
ALT: 16 U/L (ref 0–44)
AST: 30 U/L (ref 15–41)
Albumin: 4.7 g/dL (ref 3.5–5.0)
Alkaline Phosphatase: 131 U/L — ABNORMAL HIGH (ref 38–126)
Anion gap: 14 (ref 5–15)
BUN: 15 mg/dL (ref 6–20)
CO2: 21 mmol/L — ABNORMAL LOW (ref 22–32)
Calcium: 9.5 mg/dL (ref 8.9–10.3)
Chloride: 102 mmol/L (ref 98–111)
Creatinine, Ser: 1 mg/dL (ref 0.44–1.00)
GFR, Estimated: >60 mL/min (ref >60)
Glucose, Bld: 129 mg/dL — ABNORMAL HIGH (ref 70–99)
Potassium: 4.4 mmol/L (ref 3.5–5.1)
Sodium: 137 mmol/L (ref 135–145)
Total Bilirubin: 0.9 mg/dL (ref 0.3–1.2)
Total Protein: 9.2 g/dL — ABNORMAL HIGH (ref 6.5–8.1)

## 2022-07-02 LAB — RESP PANEL BY RT-PCR (RSV, FLU A&B, COVID)  RVPGX2
Influenza A by PCR: NEGATIVE
Influenza B by PCR: NEGATIVE
Resp Syncytial Virus by PCR: NEGATIVE
SARS Coronavirus 2 by RT PCR: NEGATIVE

## 2022-07-02 LAB — URINALYSIS, ROUTINE W REFLEX MICROSCOPIC
Bilirubin Urine: NEGATIVE
Glucose, UA: NEGATIVE mg/dL
Ketones, ur: NEGATIVE mg/dL
Leukocytes,Ua: NEGATIVE
Nitrite: NEGATIVE
Protein, ur: 30 mg/dL — AB
Specific Gravity, Urine: 1.015 (ref 1.005–1.030)
pH: 6 (ref 5.0–8.0)

## 2022-07-02 LAB — LIPASE, BLOOD: Lipase: 32 U/L (ref 11–51)

## 2022-07-02 MED ORDER — ALUM & MAG HYDROXIDE-SIMETH 200-200-20 MG/5ML PO SUSP
30.0000 mL | Freq: Once | ORAL | Status: AC
  Administered 2022-07-02: 30 mL via ORAL
  Filled 2022-07-02: qty 30

## 2022-07-02 MED ORDER — DROPERIDOL 2.5 MG/ML IJ SOLN
2.5000 mg | Freq: Once | INTRAMUSCULAR | Status: AC
  Administered 2022-07-02: 2.5 mg via INTRAVENOUS
  Filled 2022-07-02: qty 2

## 2022-07-02 MED ORDER — PANTOPRAZOLE SODIUM 40 MG PO TBEC: 40.0000 mg | DELAYED_RELEASE_TABLET | Freq: Every day | ORAL | 0 refills | Status: DC

## 2022-07-02 MED ORDER — ACETAMINOPHEN 325 MG PO TABS
650.0000 mg | ORAL_TABLET | Freq: Once | ORAL | Status: AC
  Administered 2022-07-02: 650 mg via ORAL
  Filled 2022-07-02: qty 2

## 2022-07-02 MED ORDER — SODIUM CHLORIDE 0.9 % IV BOLUS
1000.0000 mL | Freq: Once | INTRAVENOUS | Status: AC
  Administered 2022-07-02: 1000 mL via INTRAVENOUS

## 2022-07-02 MED ORDER — ONDANSETRON HCL 4 MG/2ML IJ SOLN: 4.0000 mg | Freq: Once | INTRAMUSCULAR | Status: DC

## 2022-07-02 MED ORDER — DICYCLOMINE HCL 10 MG/ML IM SOLN
20.0000 mg | Freq: Once | INTRAMUSCULAR | Status: AC
  Administered 2022-07-02: 20 mg via INTRAMUSCULAR
  Filled 2022-07-02: qty 2

## 2022-07-02 MED ORDER — ONDANSETRON HCL 4 MG PO TABS: 4.0000 mg | ORAL_TABLET | Freq: Four times a day (QID) | ORAL | 0 refills | Status: DC

## 2022-07-02 MED ORDER — FENTANYL CITRATE PF 50 MCG/ML IJ SOSY
25.0000 ug | PREFILLED_SYRINGE | Freq: Once | INTRAMUSCULAR | Status: AC
  Administered 2022-07-02: 25 ug via INTRAVENOUS
  Filled 2022-07-02: qty 1

## 2022-07-02 MED ORDER — DICYCLOMINE HCL 20 MG PO TABS: 20.0000 mg | ORAL_TABLET | Freq: Two times a day (BID) | ORAL | 0 refills | Status: AC

## 2022-07-02 MED ORDER — HYDROMORPHONE HCL 1 MG/ML IJ SOLN
1.0000 mg | Freq: Once | INTRAMUSCULAR | Status: DC
  Filled 2022-07-02: qty 1

## 2022-07-02 MED ORDER — HYDROMORPHONE HCL 1 MG/ML IJ SOLN
1.0000 mg | Freq: Once | INTRAMUSCULAR | Status: AC
  Administered 2022-07-02: 1 mg via INTRAMUSCULAR

## 2022-07-02 MED ORDER — IOHEXOL 300 MG/ML  SOLN
100.0000 mL | Freq: Once | INTRAMUSCULAR | Status: AC | PRN
  Administered 2022-07-02: 100 mL via INTRAVENOUS

## 2022-07-02 MED ORDER — ONDANSETRON HCL 4 MG PO TABS: 4.0000 mg | ORAL_TABLET | Freq: Four times a day (QID) | ORAL | 0 refills | Status: AC

## 2022-07-02 NOTE — ED Notes (Signed)
Patient apparently was asleep per daughter and pulled out both IV sites.

## 2022-07-02 NOTE — ED Triage Notes (Signed)
Pt brought in triage  moaning due to abdominal pain and HA. Pt is accompanied by daughter. Pain began 0930 this morning. Vomiting yellow bile this am.

## 2022-07-02 NOTE — ED Provider Notes (Signed)
[facility] EMERGENCY DEPARTMENT Provider Note   CSN: [id number] Arrival date & time: 07/02/22  1623     History  Chief Complaint  Patient presents with   Abdominal Pain    Traci Johnston is a 62 y.o. female.  She reports complaints of abdominal pain with nausea vomiting diarrhea that started at 930 this morning with sharp pain in the upper abdomen.  She has history of IBS.  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 Authorizing Provider  ALPRAZolam Duanne Moron) 1 MG tablet Take 1 mg by mouth 3 (three) times daily as needed.  04/13/18   [provider]  amitriptyline (ELAVIL) 50 MG tablet TAKE 1 TABLET(50 MG) BY MOUTH AT BEDTIME **PLEASE CONTACT OFFICE TO SCHEDULE FOLLOW UP** 09/07/21   Pyrtle, Lajuan Lines, MD  amLODipine (NORVASC) 5 MG tablet Take 5 mg by mouth daily.  03/26/18   [provider]  bifidobacterium infantis (ALIGN) capsule Take 1 capsule by mouth daily. 12/31/13   Pyrtle, Lajuan Lines, MD  chlordiazePOXIDE (LIBRIUM) 5 MG capsule TAKE 1 CAPSULE BY MOUTH THREE TIMES DAILY BEFORE MEALS 01/01/21   Esterwood, Amy S, PA-C  diphenoxylate-atropine (LOMOTIL) 2.5-0.025 MG tablet TAKE 1 TABLET BY MOUTH FOUR TIMES DAILY AS NEEDED FOR DIARRHEA OR LOOSE STOOLS 12/11/18   Pyrtle, Lajuan Lines, MD  FLUoxetine (PROZAC) 40 MG capsule Take 40 mg by mouth daily.     [provider]  fluticasone (FLONASE) 50 MCG/ACT nasal spray SHAKE LQ AND U 1 SPR IEN BID 03/29/18   [provider]  glycopyrrolate (ROBINUL) 2 MG tablet TAKE 1 TABLET BY MOUTH TWICE DAILY 03/18/22   Pyrtle, Lajuan Lines, MD  omeprazole (PRILOSEC) 20 MG capsule TAKE 1 CAPSULE(20 MG) BY MOUTH TWICE DAILY 05/02/20   Pyrtle, Lajuan Lines, MD  pregabalin (LYRICA) 50 MG capsule Take 50 mg by mouth 3 (three) times daily. Taking as needed    [provider]      Allergies    Aspirin and Codeine    Review of Systems   Review of Systems  Gastrointestinal:  Positive for abdominal pain.    Physical Exam Updated Vital Signs BP (!) 176/106 (BP Location: Right Arm)   Pulse 69   Temp 97.6 F (36.4 C) (Oral)   Resp 18   Wt 87.5 kg   SpO2 99%   BMI 31.87 kg/m  Physical Exam Vitals and nursing note reviewed.  Constitutional:      General: She is not in acute distress.    Appearance: She is well-developed.  HENT:     Head: Normocephalic and atraumatic.  Eyes:     Conjunctiva/sclera: Conjunctivae normal.  Cardiovascular:     Rate and Rhythm: Normal rate and regular rhythm.     Heart sounds: No murmur heard. Pulmonary:     Effort: Pulmonary effort is normal. No respiratory distress.     Breath sounds: Normal breath sounds.  Abdominal:     Palpations: Abdomen is soft.     Tenderness: There is abdominal tenderness in the epigastric area.  Musculoskeletal:        General: No swelling.     Cervical back: Neck supple.  Skin:    General: Skin is warm and dry.     Capillary Refill: Capillary refill takes less than 2 seconds.  Neurological:  Mental Status: She is alert.  Psychiatric:        Mood and Affect: Mood normal.     ED Results / Procedures / Treatments   Labs (all labs ordered are listed, but only abnormal results are displayed) Labs Reviewed  CBC WITH DIFFERENTIAL/PLATELET  COMPREHENSIVE METABOLIC PANEL  LIPASE, BLOOD  URINALYSIS, ROUTINE W REFLEX MICROSCOPIC    EKG None  Radiology No results found.  Procedures Procedures    Medications Ordered in ED Medications  sodium chloride 0.9 % bolus 1,000 mL (has no administration in time range)  droperidol (INAPSINE) 2.5 MG/ML injection 2.5 mg (has no administration in time range)    ED Course/ Medical Decision Making/ A&P                             Medical Decision Making Differential diagnosis: Peptic ulcer disease, pancreatitis, gastroenteritis,  other ED course: Patient presents the ER with epigastric abdominal pain with tenderness.  She has history of IBS, she also having nausea with vomiting and diarrhea.  Initially given droperidol and on reassessment she still having pain, labs are overall reassuring and still pending urinalysis.  ECG was ordered and shows no ischemic changes.  Patient is getting IV fluids, ordered morphine and plan to reassess.  Given that she is having significant tenderness we will also plan on CT scan to rule out acute intra-abdominal pathology.  Lactic acid ordered to assess for possible mesenteric ischemia though patient does not have atrial fibrillation or significant vascular disease history.  Prior ED charts showed similar presentations with reassuring workups.  Signed out to the oncoming team  Risk Prescription drug management.           Final Clinical Impression(s) / ED Diagnoses Final diagnoses:  None    Rx / DC Orders ED Discharge Orders     None         Darci Current 07/02/22 1906    Gareth Morgan, MD 07/03/22 1125

## 2022-07-02 NOTE — ED Provider Notes (Signed)
  Physical Exam  BP 123/71 (BP Location: Left Arm)   Pulse (!) 102   Temp 98.5 F (36.9 C) (Oral)   Resp 18   Wt 87.5 kg   SpO2 100%   BMI 31.87 kg/m   Physical Exam Vitals and nursing note reviewed.  Constitutional:      Appearance: She is well-developed.  Cardiovascular:     Rate and Rhythm: Normal rate.  Pulmonary:     Effort: Pulmonary effort is normal.  Abdominal:     General: Abdomen is flat. Bowel sounds are decreased.     Palpations: Abdomen is soft.     Tenderness: There is abdominal tenderness. There is no right CVA tenderness or left CVA tenderness.  Skin:    General: Skin is warm and dry.  Neurological:     Mental Status: She is alert.     Procedures  Procedures  ED Course / MDM    Medical Decision Making Amount and/or Complexity of Data Reviewed Labs: ordered. Radiology: ordered.  Risk OTC drugs. Prescription drug management.   Patient care assumed from [place] B. PA at shift change, please see her note for a full HPI. Briefly, patient here with underlying IBS followed by [facility] gastroenterology, underlying history of H. pylori, diverticulosis, anxiety and depression.  Plan was for follow-up on CT, reassessment.  CT of her abdomen did not show any acute findings.  Vital signs were rechecked afterwards patient did spike a fever of 100.4.  She was given Maalox, Dilaudid to help with pain control.  I discussed this case with my attending Dr. Billy Fischer, who agrees with patient management. Patient with ongoing IBS and fever, negative CT some suspicion for viral illness. Symptomatically treated, stable for discharge.     Portions of this note were generated with [employer]. Dictation errors may occur despite best attempts at proofreading.        Janeece Fitting, PA-C 07/03/22 0005    Gareth Morgan, MD 07/03/22 [phone]

## 2022-07-02 NOTE — ED Notes (Signed)
Pt difficult IV access. Able to obtain # 22 left hand

## 2022-07-06 ENCOUNTER — Encounter: Payer: Self-pay | Admitting: Gastroenterology

## 2022-07-06 ENCOUNTER — Ambulatory Visit (INDEPENDENT_AMBULATORY_CARE_PROVIDER_SITE_OTHER): Payer: Self-pay | Admitting: Gastroenterology

## 2022-07-06 VITALS — BP 108/74 | HR 60 | Ht 66.5 in | Wt 189.2 lb

## 2022-07-06 DIAGNOSIS — K589 Irritable bowel syndrome without diarrhea: Secondary | ICD-10-CM

## 2022-07-06 MED ORDER — DIPHENOXYLATE-ATROPINE 2.5-0.025 MG PO TABS: 1.0000 | ORAL_TABLET | Freq: Every day | ORAL | 2 refills | Status: DC | PRN

## 2022-07-06 MED ORDER — PANTOPRAZOLE SODIUM 40 MG PO TBEC: 40.0000 mg | DELAYED_RELEASE_TABLET | Freq: Every day | ORAL | 3 refills | Status: DC

## 2022-07-06 MED ORDER — AMITRIPTYLINE HCL 50 MG PO TABS: ORAL_TABLET | ORAL | 2 refills | Status: DC

## 2022-07-06 NOTE — Patient Instructions (Signed)
We have sent the following medications to your pharmacy for you to pick up at your convenience: Pantoprazole 40 mg daily 30-60 minutes breakfast.  Amitriptyline 50 mg nightly.  Lomotil daily as needed for diarrhea.   Follow up with Dr. Hilarie Fredrickson in 6-8 weeks.  _______________________________________________________  If your blood pressure at your visit was 140/90 or greater, please contact your primary care physician to follow up on this.  _______________________________________________________  If you are age 21 or older, your body mass index should be between 23-30. Your Body mass index is 30.08 kg/m. If this is out of the aforementioned range listed, please consider follow up with your Primary Care Provider.  If you are age 53 or younger, your body mass index should be between 19-25. Your Body mass index is 30.08 kg/m. If this is out of the aformentioned range listed, please consider follow up with your Primary Care Provider.   ________________________________________________________  The [facility] providers would like to encourage you to use [facility] to communicate with providers for non-urgent requests or questions.  Due to long hold times on the telephone, sending your provider a message by [facility] may be a faster and more efficient way to get a response.  Please allow 48 business hours for a response.  Please remember that this is for non-urgent requests.  _______________________________________________________

## 2022-07-06 NOTE — Progress Notes (Signed)
07/06/2022 Traci Johnston [id number] 07/30/1960   HISTORY OF PRESENT ILLNESS:  This is a 62 year old female who is a patient of Dr. Vena Rua with a history of IBS with both diarrhea and constipation, prior gallstones status post cholecystectomy with postsurgical CBD dilatation, history of H. pylori status post treatment, colonic diverticulosis, anxiety and depression who is here for follow-up. She was last seen in the office in June 2021. She is here alone today for follow-up of a recent ED visit.    She tells me that last Friday she woke up with a headache.  This progressed to vomiting and diarrhea.  She says that she started feeling like she wanted to faint.  She went to the emergency department.  White blood cell count was slightly elevated 11.9 K.  Otherwise labs unremarkable for cause of her symptoms.  CT scan abdomen and pelvis with contrast on 07/02/2022 was unremarkable.  They told her that it was likely a viral gastroenteritis and discharged her home with bentyl and zofran.  She is feeling somewhat better, but says that she is just feeling kind of blah.  She has been off of her amitriptyline and her pantoprazole.  She would like to restart those since they have helped her in the past.  Colonoscopy July 2021 with only hemorrhoids.  Random colon biopsies were normal.  EGD July 2021 with only gastritis and a single gastric nodule.  Small bowel biopsies were normal.  Gastric biopsy showed a hyperplastic polyp and chronic active gastritis without H. pylori.  Past Medical History:  Diagnosis Date   Anxiety    Common bile duct dilatation    Depression    Gallstones    GERD (gastroesophageal reflux disease)    hx. 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 Her smoking use included cigarettes. She has a 12.50 pack-year smoking history. She has never used smokeless tobacco. She reports current alcohol use. She reports that she does not use drugs. family history includes Breast cancer in her mother and sister; Prostate cancer in her father. Allergies  Allergen Reactions   Aspirin     hyperventilate   Codeine     hyperventilates      Outpatient Encounter Medications as of 07/06/2022  Medication Sig   ALPRAZolam (XANAX) 1 MG tablet Take 1 mg by mouth 3 (three) times daily as needed.    amLODipine (NORVASC) 5 MG tablet Take 5 mg by mouth daily.    bifidobacterium infantis (ALIGN) capsule Take 1 capsule by mouth daily.   dicyclomine (BENTYL) 20 MG tablet Take 1 tablet (20 mg total) by mouth 2 (two) times daily.   diphenoxylate-atropine (LOMOTIL) 2.5-0.025 MG tablet TAKE 1 TABLET BY MOUTH FOUR TIMES DAILY AS NEEDED FOR DIARRHEA OR LOOSE STOOLS   FLUoxetine (PROZAC) 40 MG capsule Take 40 mg by mouth daily.  fluticasone (FLONASE) 50 MCG/ACT nasal spray SHAKE LQ AND U 1 SPR IEN BID   glycopyrrolate (ROBINUL) 2 MG tablet TAKE 1 TABLET BY MOUTH TWICE DAILY   ondansetron (ZOFRAN) 4 MG tablet Take 1 tablet (4 mg total) by mouth every 6 (six) hours.   pantoprazole (PROTONIX) 40 MG tablet Take 1 tablet (40 mg total) by mouth daily for 14 days.   amitriptyline (ELAVIL) 50 MG tablet TAKE 1 TABLET(50 MG) BY MOUTH AT BEDTIME **PLEASE CONTACT OFFICE TO SCHEDULE FOLLOW UP** (Patient not taking: Reported on 07/06/2022)   chlordiazePOXIDE (LIBRIUM) 5 MG capsule TAKE 1 CAPSULE BY MOUTH THREE TIMES DAILY BEFORE MEALS (Patient not taking:  Reported on 07/06/2022)   pregabalin (LYRICA) 50 MG capsule Take 50 mg by mouth 3 (three) times daily. Taking as needed (Patient not taking: Reported on 07/06/2022)   No facility-administered encounter medications on file as of 07/06/2022.     REVIEW OF SYSTEMS  : All other systems reviewed and negative except where noted in the History of Present Illness.   PHYSICAL EXAM: BP 108/74   Pulse 60   Ht 5' 6.5" (1.689 m)   Wt 189 lb 3.2 oz (85.8 kg)   BMI 30.08 kg/m  General: Well developed female in no acute distress Head: Normocephalic and atraumatic Eyes:  Sclerae anicteric, conjunctiva pink. Ears: Normal auditory acuity Lungs: Clear throughout to auscultation; no W/R/R. Heart: Regular rate and rhythm; no M/R/G. Abdomen: Soft, non-distended.  BS present.  Non-tender. Musculoskeletal: Symmetrical with no gross deformities  Skin: No lesions on visible extremities Extremities: No edema  Neurological: Alert oriented x 4, grossly non-focal Psychological:  Alert and cooperative. Normal mood and affect  ASSESSMENT AND PLAN: 61 year old female with a history of IBS with both diarrhea and constipation, prior gallstones status post cholecystectomy with postsurgical CBD dilatation, history of H. pylori status post treatment, colonic diverticulosis, anxiety and depression who is here for follow-up.   *She has chronic IBS with alternating constipation and diarrhea and complaints of abdominal pain.  More acutely she had sudden onset of vomiting, diarrhea, abdominal pain last week.  CT scan negative.  This was likely some type of acute viral gastroenteritis on top of her chronic IBS.  She had done well previously with amitriptyline 50 mg at bedtime and would like to restart that.  She also needs refills on her pantoprazole, which she has not been taking.  Sent a prescription for 40 mg daily of that as well.  Will renew Lomotil prescription as needed for diarrhea.  Those were all sent to her pharmacy.   She will follow-up with Dr. Hilarie Fredrickson in 6 to 8 weeks to see how she is doing back on those medications. CC:  Lin Landsman, MD

## 2022-07-08 ENCOUNTER — Telehealth: Payer: Self-pay | Admitting: Pharmacy Technician

## 2022-07-08 NOTE — Telephone Encounter (Signed)
Patient Advocate Encounter  Received notification from [facility] that prior authorization for PANTOPRAZOLE '40MG'$  is required.   PA submitted on 1.18.24 Key [id number] Status is pending

## 2022-07-14 ENCOUNTER — Encounter: Payer: Self-pay | Admitting: Gastroenterology

## 2022-07-20 NOTE — Progress Notes (Signed)
Addendum: Reviewed and agree with assessment and management plan. Zymier Rodgers M, MD  

## 2022-09-07 ENCOUNTER — Encounter: Payer: Self-pay | Admitting: Internal Medicine

## 2022-09-07 ENCOUNTER — Ambulatory Visit (INDEPENDENT_AMBULATORY_CARE_PROVIDER_SITE_OTHER): Payer: BLUE CROSS/BLUE SHIELD | Admitting: Internal Medicine

## 2022-09-07 VITALS — BP 130/70 | HR 63 | Ht 65.5 in | Wt 197.0 lb

## 2022-09-07 DIAGNOSIS — K219 Gastro-esophageal reflux disease without esophagitis: Secondary | ICD-10-CM | POA: Diagnosis not present

## 2022-09-07 DIAGNOSIS — R12 Heartburn: Secondary | ICD-10-CM | POA: Diagnosis not present

## 2022-09-07 DIAGNOSIS — Z8619 Personal history of other infectious and parasitic diseases: Secondary | ICD-10-CM | POA: Diagnosis not present

## 2022-09-07 DIAGNOSIS — K582 Mixed irritable bowel syndrome: Secondary | ICD-10-CM

## 2022-09-07 NOTE — Progress Notes (Signed)
Subjective:    Patient ID: Traci Johnston, female    DOB: 1961/05/04, 62 y.o.   MRN: [id number]  HPI Traci Johnston is a 62 year old female with a history of IBS with both diarrhea and constipation, prior gallstones status post cholecystectomy with postsurgical CBD dilatation, history of H. pylori status post treatment, colonic diverticulosis, anxiety and depression who is here for follow-up. She was last seen in the office in January 2024 by Alonza Bogus, PA-C. She is here alone today.   At the time of her last visit she was suffering from a flare of her IBS.  She reports that this is significantly improved at this time.  She feels that it was related predominantly to stress given that her husband had required multiple hospitalizations.  She also had a sister requiring a 1 month hospitalization and a cousin who was also ill.  Her husband continues to have exacerbations of his chronic medical conditions.  She is living now with her son and her daughter and their families.  This can be stressful for her at times but she is very thankful for their help.  Her bowel habits have normalized for her.  She is having some minor heartburn and burning sensation substernally worse after eating.  Occasional nausea and crampy abdominal pain but nothing out of ordinary for her.  She is not having dysphagia or odynophagia.  No blood in stool or melena.  She is taking pantoprazole 40 mg once a day.  She is using glycopyrrolate 2 mg twice a day and taking Align daily.  She is using 50 mg of amitriptyline at bedtime.  She will occasionally use Lomotil or dicyclomine.  Review of Systems As per HPI, otherwise negative  Current Medications, Allergies, Past Medical History, Past Surgical History, Family History and Social History were reviewed in [employer] record.    Objective:   Physical Exam BP 130/70   Pulse 63   Ht 5' 5.5" (1.664 m) Comment: without shoes  Wt 197 lb (89.4 kg)   BMI 32.28  kg/m  Gen: awake, alert, NAD HEENT: anicteric  Ext: no c/c/e Neuro: nonfocal     Latest Ref Rng & Units 07/02/2022    5:10 PM 12/14/2019   11:47 AM 12/07/2018    2:13 PM  CBC  WBC 4.0 - 10.5 K/uL 11.9  5.9  5.8   Hemoglobin 12.0 - 15.0 g/dL 13.8  12.5  12.5   Hematocrit 36.0 - 46.0 % 44.5  38.9  39.6   Platelets 150 - 400 K/uL 239  306.0  286.0    CMP     Component Value Date/Time   NA 137 07/02/2022 1710   K 4.4 07/02/2022 1710   CL 102 07/02/2022 1710   CO2 21 (L) 07/02/2022 1710   GLUCOSE 129 (H) 07/02/2022 1710   BUN 15 07/02/2022 1710   CREATININE 1.00 07/02/2022 1710   CALCIUM 9.5 07/02/2022 1710   PROT 9.2 (H) 07/02/2022 1710   ALBUMIN 4.7 07/02/2022 1710   AST 30 07/02/2022 1710   ALT 16 07/02/2022 1710   ALKPHOS 131 (H) 07/02/2022 1710   BILITOT 0.9 07/02/2022 1710   GFRNONAA >60 07/02/2022 1710   GFRAA >60 02/04/2016 1840    CT ABDOMEN AND PELVIS WITH CONTRAST   TECHNIQUE: Multidetector CT imaging of the abdomen and pelvis was performed using the standard protocol following bolus administration of intravenous contrast.   RADIATION DOSE REDUCTION: This exam was performed according to the departmental dose-optimization program which  includes automated exposure control, adjustment of the mA and/or kV according to patient size and/or use of iterative reconstruction technique.   CONTRAST:  157mL OMNIPAQUE IOHEXOL 300 MG/ML  SOLN   COMPARISON:  07/04/2018   FINDINGS: Lower chest: No acute abnormality   Hepatobiliary: Prior cholecystectomy. Common bile duct dilated measuring 11 mm, stable since prior study likely related to post cholecystectomy state.   Pancreas: No focal abnormality or ductal dilatation.   Spleen: No focal abnormality.  Normal size.   Adrenals/Urinary Tract: No adrenal abnormality. No focal renal abnormality. No stones or hydronephrosis. Urinary bladder is unremarkable.   Stomach/Bowel: Stomach, large and small bowel grossly  unremarkable.   Vascular/Lymphatic: No evidence of aneurysm or adenopathy. Aortoiliac atherosclerosis.   Reproductive: Prior hysterectomy.  No adnexal masses.   Other: No free fluid or free air.   Musculoskeletal: No acute bony abnormality.   IMPRESSION: No acute findings in the abdomen or pelvis.   Scattered aortoiliac atherosclerosis.     Electronically Signed   By: Rolm Baptise M.D.   On: 07/02/2022 20:10       Assessment & Plan:  62 year old female with a history of IBS with both diarrhea and constipation, prior gallstones status post cholecystectomy with postsurgical CBD dilatation, history of H. pylori status post treatment, colonic diverticulosis, anxiety and depression who is here for follow-up.   Longstanding IBS with diarrhea and constipation --she looks well today and her recent flare of irritable bowel has improved.  This was very likely stress-induced.  She seems to benefit from scheduled antispasmodic as well as TCA therapy. -- Continue amitriptyline 50 mg nightly -- Continue glycopyrrolate 2 mg twice daily -- She wishes to continue Align -- Can use Lomotil 1 tablet 3 times daily as needed for diarrhea  2.  GERD/pyrosis --mild heartburn symptom.  On once daily PPI.  Last upper endoscopy July 2021.  Normal esophagus at that time -- Increase pantoprazole to 40 mg twice daily AC for 1 month -- Return to clinic if symptoms persist -- If improvement reduce to once daily PPI after 1 month -- GERD diet and lifestyle modification  3.  History of H. Pylori --[facility] H. pylori stool antigen test today  4.  CRC screening --colonoscopy July 2021, normal.  Repeat in 10 yrs  30 minutes total spent today including patient facing time, coordination of care, reviewing medical history/procedures/pertinent radiology studies, and documentation of the encounter.

## 2022-09-07 NOTE — Patient Instructions (Addendum)
Your provider has ordered "[facility]" stool testing for you. You have received a kit from our office today containing all necessary supplies to complete this test. Please carefully read the stool collection instructions provided in the kit before opening the accompanying materials. In addition, be sure to place the label from the top left corner of the laboratory request sheet onto the "puritan opti-swab" tube that is supplied in the kit. This label should include your full name and date of birth. After completing the test, you should secure the purtian tube into the specimen biohazard bag. The laboratory request information sheet (including date and time of specimen collection) should be placed into the outside pocket of the specimen biohazard bag and returned to the [place] lab with 2 days of collection.   Increase your pantoprazole to twice daily for heartburn for 1 month if this improves your heartburn go back down to once daily.  Continue your amitriptyline and your Robinul.  _______________________________________________________  If your blood pressure at your visit was 140/90 or greater, please contact your primary care physician to follow up on this.  _______________________________________________________  If you are age 17 or older, your body mass index should be between 23-30. Your Body mass index is 32.28 kg/m. If this is out of the aforementioned range listed, please consider follow up with your Primary Care Provider.  If you are age 6 or younger, your body mass index should be between 19-25. Your Body mass index is 32.28 kg/m. If this is out of the aformentioned range listed, please consider follow up with your Primary Care Provider.   ________________________________________________________  The [facility] providers would like to encourage you to use [facility] to communicate with providers for non-urgent requests or questions.  Due to long hold times on the telephone, sending your  provider a message by [facility] may be a faster and more efficient way to get a response.  Please allow 48 business hours for a response.  Please remember that this is for non-urgent requests.  _______________________________________________________

## 2022-09-12 ENCOUNTER — Other Ambulatory Visit: Payer: Self-pay | Admitting: Internal Medicine

## 2022-10-18 ENCOUNTER — Other Ambulatory Visit: Payer: Self-pay | Admitting: Gastroenterology

## 2022-12-10 NOTE — Telephone Encounter (Signed)
Patient Advocate Encounter  Prior Authorization for PANTOPRAZOLE 40MG  has been approved with [employer].    PA#  Effective dates: 1.18.24 through 1.16.25

## 2022-12-11 ENCOUNTER — Other Ambulatory Visit: Payer: Self-pay | Admitting: Internal Medicine

## 2023-01-08 ENCOUNTER — Other Ambulatory Visit: Payer: Self-pay | Admitting: Gastroenterology

## 2023-02-14 ENCOUNTER — Other Ambulatory Visit: Payer: Self-pay | Admitting: Gastroenterology

## 2023-04-10 ENCOUNTER — Other Ambulatory Visit: Payer: Self-pay | Admitting: Internal Medicine

## 2023-05-13 ENCOUNTER — Other Ambulatory Visit: Payer: Self-pay | Admitting: Internal Medicine

## 2023-07-15 ENCOUNTER — Other Ambulatory Visit: Payer: Self-pay | Admitting: Internal Medicine

## 2023-08-05 ENCOUNTER — Other Ambulatory Visit: Payer: Self-pay | Admitting: Gastroenterology

## 2023-08-12 ENCOUNTER — Other Ambulatory Visit: Payer: Self-pay | Admitting: Gastroenterology

## 2023-08-14 ENCOUNTER — Other Ambulatory Visit: Payer: Self-pay | Admitting: Internal Medicine

## 2023-08-29 ENCOUNTER — Other Ambulatory Visit: Payer: Self-pay | Admitting: Internal Medicine

## 2023-09-13 ENCOUNTER — Other Ambulatory Visit: Payer: Self-pay | Admitting: Internal Medicine

## 2023-10-20 ENCOUNTER — Other Ambulatory Visit: Payer: Self-pay | Admitting: Internal Medicine

## 2023-11-15 ENCOUNTER — Other Ambulatory Visit: Payer: Self-pay | Admitting: Internal Medicine

## 2023-12-15 ENCOUNTER — Other Ambulatory Visit: Payer: Self-pay | Admitting: Internal Medicine

## 2024-01-16 ENCOUNTER — Other Ambulatory Visit: Payer: Self-pay | Admitting: Internal Medicine

## 2024-02-11 ENCOUNTER — Other Ambulatory Visit: Payer: Self-pay | Admitting: Internal Medicine

## 2024-02-13 NOTE — Telephone Encounter (Signed)
 Ok to refill Routine OV for continuity with POD A APP

## 2024-02-14 ENCOUNTER — Other Ambulatory Visit: Payer: Self-pay | Admitting: Internal Medicine

## 2024-04-15 NOTE — Progress Notes (Deleted)
 Traci Console, PA-C [address] [place], [place]  [address] Phone: [phone]   Primary Care Physician: Ilah Crigler, MD  Primary Gastroenterologist:  Traci Console, PA-C / Dr. Gordy Starch   Chief Complaint: IBS, GERD       HPI:   Discussed the use of AI scribe software for clinical note transcription with the patient, who gave verbal consent to proceed.  History of Present Illness   Follow-up irritable bowel syndrome, GERD, and medication refill.  Patient was last seen on 08/2022 by Dr. Starch.  She has history of IBS with diarrhea and constipation.  Prior gallstones status post cholecystectomy with postsurgical CBD dilatation, history of H. pylori status post treatment, colonic diverticulosis, anxiety and depression.  06/2022 CT abdomen pelvis with contrast: No acute abnormality.  12/2019 last colonoscopy: No polyps.  Small internal hemorrhoids.  Biopsies negative for microscopic colitis.  10-year repeat.  12/2019 last EGD: 1 small 8 mm hyperplastic gastric polyp.  Moderate gastritis.  Normal duodenum and esophagus.  Biopsies positive for H. pylori gastritis.  Treated with Pylera.  Biopsies negative for celiac.    Current Outpatient Medications  Medication Sig Dispense Refill   ALPRAZolam (XANAX) 1 MG tablet Take 1 mg by mouth 3 (three) times daily as needed.   0   amitriptyline  (ELAVIL ) 50 MG tablet TAKE 1 TABLET(50 MG) BY MOUTH AT BEDTIME 30 tablet 1   amLODipine (NORVASC) 5 MG tablet Take 5 mg by mouth daily.   3   bifidobacterium infantis (ALIGN) capsule Take 1 capsule by mouth daily. 7 capsule 0   dicyclomine  (BENTYL ) 20 MG tablet Take 1 tablet (20 mg total) by mouth 2 (two) times daily. 20 tablet 0   diphenoxylate -atropine  (LOMOTIL ) 2.5-0.025 MG tablet TAKE 1 TABLET BY MOUTH AS NEEDED FOR DIARRHEA OR LOOSE STOOLS 30 tablet 1   FLUoxetine (PROZAC) 40 MG capsule Take 40 mg by mouth daily.      fluticasone (FLONASE) 50 MCG/ACT nasal spray As needed  3    glycopyrrolate  (ROBINUL ) 2 MG tablet TAKE 1 TABLET BY MOUTH TWICE DAILY 180 tablet 0   ondansetron  (ZOFRAN ) 4 MG tablet Take 1 tablet (4 mg total) by mouth every 6 (six) hours. (Patient taking differently: Take 4 mg by mouth every 6 (six) hours as needed.) 20 tablet 0   pantoprazole  (PROTONIX ) 40 MG tablet TAKE 1 TABLET(40 MG) BY MOUTH DAILY 90 tablet 3   No current facility-administered medications for this visit.    Allergies as of 04/16/2024 - Review Complete 09/07/2022  Allergen Reaction Noted   Aspirin  09/04/2011   Codeine  11/12/2014    Past Medical History:  Diagnosis Date   Anxiety    Common bile duct dilatation    Depression    Gallstones    GERD (gastroesophageal reflux disease)    hx. 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    Physical Exam:  There were no vitals taken for this visit. No LMP recorded. Patient has had a hysterectomy.  General: Well-nourished, well-developed in no acute distress.  Lungs: Clear to auscultation bilaterally. Non-labored. Heart: Regular rate and rhythm, no murmurs rubs or gallops.  Abdomen: Bowel sounds are normal; Abdomen is Soft; No hepatosplenomegaly, masses or hernias;  No Abdominal Tenderness; No guarding or rebound tenderness. Neuro: Alert and oriented x 3.  Grossly intact.  Psych: Alert and cooperative, normal mood and affect.   Imaging  Studies: No results found.  Labs: CBC    Component Value Date/Time   WBC 11.9 (H) 07/02/2022 1710   RBC 5.04 07/02/2022 1710   HGB 13.8 07/02/2022 1710   HCT 44.5 07/02/2022 1710   PLT 239 07/02/2022 1710   MCV 88.3 07/02/2022 1710   MCH 27.4 07/02/2022 1710   MCHC 31.0 07/02/2022 1710   RDW 14.2 07/02/2022 1710   LYMPHSABS 0.7 07/02/2022 1710   MONOABS 0.3 07/02/2022 1710   EOSABS 0.0 07/02/2022 1710   BASOSABS 0.0 07/02/2022 1710    CMP     Component Value Date/Time   NA 137 07/02/2022 1710   K 4.4 07/02/2022 1710   CL 102 07/02/2022 1710   CO2 21 (L) 07/02/2022 1710   GLUCOSE 129 (H) 07/02/2022 1710   BUN 15 07/02/2022 1710   CREATININE 1.00 07/02/2022 1710   CALCIUM 9.5 07/02/2022 1710   PROT 9.2 (H) 07/02/2022 1710   ALBUMIN 4.7 07/02/2022 1710   AST 30 07/02/2022 1710   ALT 16 07/02/2022 1710   ALKPHOS 131 (H) 07/02/2022 1710   BILITOT 0.9 07/02/2022 1710   GFRNONAA >60 07/02/2022 1710   GFRAA >60 02/04/2016 1840       Assessment and Plan:   Larcenia Holaday is a 63 y.o. y/o female returns for follow-up of  IBS with both diarrhea and constipation, prior gallstones status post cholecystectomy with postsurgical CBD dilatation, history of H. pylori status post treatment, colonic diverticulosis, anxiety and depression.   Longstanding IBS with diarrhea and constipation. -- Continue amitriptyline  50 mg nightly -- Continue glycopyrrolate  2 mg twice daily -- She wishes to continue Align -- Can use Lomotil  1 tablet 3 times daily as needed for diarrhea   2.  GERD/pyrosis --mild heartburn symptom.  On once daily PPI.  Last upper endoscopy July 2021.  Normal esophagus at that time -- Increase pantoprazole  to 40 mg twice daily AC for 1 month -- Return to clinic if symptoms persist -- If improvement reduce to once daily PPI after 1 month -- GERD diet and lifestyle modification   3.  History of H. Pylori - -[facility] H. pylori stool antigen test today   4.  CRC  screening --colonoscopy July 2021, normal.   - Repeat in 10 yrs  Assessment and Plan Assessment & Plan       Traci Console, PA-C  Follow up ***

## 2024-04-16 ENCOUNTER — Ambulatory Visit: Admitting: Physician Assistant

## 2024-05-15 ENCOUNTER — Ambulatory Visit: Admitting: Nurse Practitioner

## 2024-05-15 NOTE — Progress Notes (Deleted)
 05/15/2024 Kimoni Pickerill [id number] 1960-09-17   Chief Complaint:  History of Present Illness: Traci Johnston is a 63 year old female with a past medical history of anxiety, depression, H. Pylori, diverticulosis, IBS with diarrhea and constipation. Past cholecystectomy secondary to gallstones with post surgical CBD dilatation. She is followed by Dr. Albertus.    Discussed the use of AI scribe software for clinical note transcription with the patient, who gave verbal consent to proceed.  History of Present Illness   She is taking pantoprazole  40 mg once a day. She is using glycopyrrolate  2 mg twice a day and taking Align daily. She is using 50 mg of amitriptyline  at bedtime. She will occasionally use Lomotil  or dicyclomine .      Latest Ref Rng & Units 07/02/2022    5:10 PM 12/14/2019   11:47 AM 12/07/2018    2:13 PM  CBC  WBC 4.0 - 10.5 K/uL 11.9  5.9  5.8   Hemoglobin 12.0 - 15.0 g/dL 86.1  87.4  87.4   Hematocrit 36.0 - 46.0 % 44.5  38.9  39.6   Platelets 150 - 400 K/uL 239  306.0  286.0         Latest Ref Rng & Units 07/02/2022    5:10 PM 12/14/2019   11:47 AM 12/07/2018    2:13 PM  CMP  Glucose 70 - 99 mg/dL 870  899  85   BUN 6 - 20 mg/dL 15  15  10    Creatinine 0.44 - 1.00 mg/dL 8.99  8.84  8.92   Sodium 135 - 145 mmol/L 137  138  140   Potassium 3.5 - 5.1 mmol/L 4.4  4.3  4.7   Chloride 98 - 111 mmol/L 102  101  102   CO2 22 - 32 mmol/L 21  29  32   Calcium 8.9 - 10.3 mg/dL 9.5  9.8  9.8   Total Protein 6.5 - 8.1 g/dL 9.2  7.3  7.3   Total Bilirubin 0.3 - 1.2 mg/dL 0.9  0.3  0.3   Alkaline Phos 38 - 126 U/L 131  129  111   AST 15 - 41 U/L 30  14  20    ALT 0 - 44 U/L 16  14  15      CT ABDOMEN AND PELVIS WITH CONTRAST   TECHNIQUE: Multidetector CT imaging of the abdomen and pelvis was performed using the standard protocol following bolus administration of intravenous contrast.   RADIATION DOSE REDUCTION: This exam was performed according to the departmental  dose-optimization program which includes automated exposure control, adjustment of the mA and/or kV according to patient size and/or use of iterative reconstruction technique.   CONTRAST:  OMNIPAQUE  IOHEXOL  300 MG/ML  SOLN   COMPARISON:  07/04/2018   FINDINGS: Lower chest: No acute abnormality   Hepatobiliary: Prior cholecystectomy. Common bile duct dilated measuring 11 mm, stable since prior study likely related to post cholecystectomy state.   Pancreas: No focal abnormality or ductal dilatation.   Spleen: No focal abnormality.  Normal size.   Adrenals/Urinary Tract: No adrenal abnormality. No focal renal abnormality. No stones or hydronephrosis. Urinary bladder is unremarkable.   Stomach/Bowel: Stomach, large and small bowel grossly unremarkable.   Vascular/Lymphatic: No evidence of aneurysm or adenopathy. Aortoiliac atherosclerosis.   Reproductive: Prior hysterectomy.  No adnexal masses.   Other: No free fluid or free air.   Musculoskeletal: No acute bony abnormality.   IMPRESSION: No acute findings in the abdomen or  pelvis.   Scattered aortoiliac atherosclerosis.  GI PROCEDURES:  EGD 12/25/2019: - Normal esophagus.  - A single gastric nodule. Biopsied.  - Gastritis. Biopsied.  - Normal examined duodenum. Biopsied.   Colonoscopy 12/25/2019: - The examined portion of the ileum was normal.  - The entire examined colon is normal. Biopsied.  - Small internal hemorrhoids.  1. Surgical [P], small bowel - BENIGN SMALL BOWEL MUCOSA. - NO VILLOUS BLUNTING OR INCREASE IN INTRAEPITHELIAL LYMPHOCYTES. - NO DYSPLASIA OR MALIGNANCY.   2. Surgical [P], gastric antrum polyp - HYPERPLASTIC POLYP(S). - NO INTESTINAL METAPLASIA, DYSPLASIA, OR MALIGNANCY.   3. Surgical [P], gastric antrum and gastric body - CHRONIC ACTIVE GASTRITIS WITH HELICOBACTER PYLORI.  - WARTHIN-STARRY IS POSITIVE FOR HELICOBACTER PYLORI.  - NO INTESTINAL METAPLASIA, DYSPLASIA, OR MALIGNANCY.   4.  Surgical [P], colon, random sites  - BENIGN COLONIC MUCOSA.  - NO ACTIVE INFLAMMATION OR EVIDENCE OF MICROSCOPIC COLITIS.  - NO DYSPLASIA OR MALIGNANCY      Current Medications, Allergies, Past Medical History, Past Surgical History, Family History and Social History were reviewed in [employer] record.   Review of Systems:   Constitutional: Negative for fever, sweats, chills or weight loss.  Respiratory: Negative for shortness of breath.   Cardiovascular: Negative for chest pain, palpitations and leg swelling.  Gastrointestinal: See HPI.  Musculoskeletal: Negative for back pain or muscle aches.  Neurological: Negative for dizziness, headaches or paresthesias.    Physical Exam: There were no vitals taken for this visit. General: in no acute distress. Head: Normocephalic and atraumatic. Eyes: No scleral icterus. Conjunctiva pink . Ears: Normal auditory acuity. Mouth: Dentition intact. No ulcers or lesions.  Lungs: Clear throughout to auscultation. Heart: Regular rate and rhythm, no murmur. Abdomen: Soft, nontender and nondistended. No masses or hepatomegaly. Normal bowel sounds x 4 quadrants.  Rectal: *** Musculoskeletal: Symmetrical with no gross deformities. Extremities: No edema. Neurological: Alert oriented x 4. No focal deficits.  Psychological: Alert and cooperative. Normal mood and affect  Assessment and Recommendations:  IBS with diarrhea and constipation   GERD  History of H. Pylori   Colon cancer screening. Normal colonoscopy 12/2019 - Next screening colonoscopy due 12/2029  S/P cholecystectomy with post operative mild CBD dilatation. Mildly elevated Alk phos level with normal AST, ALT and T. Bilirubin levels.  - Hepatic panel and GGT

## 2024-05-25 ENCOUNTER — Other Ambulatory Visit

## 2024-05-25 ENCOUNTER — Encounter: Payer: Self-pay | Admitting: Physician Assistant

## 2024-05-25 ENCOUNTER — Ambulatory Visit: Admitting: Physician Assistant

## 2024-05-25 VITALS — BP 140/80 | HR 67 | Ht 67.0 in | Wt 197.4 lb

## 2024-05-25 DIAGNOSIS — R1084 Generalized abdominal pain: Secondary | ICD-10-CM | POA: Diagnosis not present

## 2024-05-25 DIAGNOSIS — Z9049 Acquired absence of other specified parts of digestive tract: Secondary | ICD-10-CM

## 2024-05-25 DIAGNOSIS — R1013 Epigastric pain: Secondary | ICD-10-CM | POA: Diagnosis not present

## 2024-05-25 DIAGNOSIS — R112 Nausea with vomiting, unspecified: Secondary | ICD-10-CM

## 2024-05-25 DIAGNOSIS — K580 Irritable bowel syndrome with diarrhea: Secondary | ICD-10-CM

## 2024-05-25 DIAGNOSIS — Z8619 Personal history of other infectious and parasitic diseases: Secondary | ICD-10-CM

## 2024-05-25 DIAGNOSIS — R197 Diarrhea, unspecified: Secondary | ICD-10-CM

## 2024-05-25 LAB — COMPREHENSIVE METABOLIC PANEL WITH GFR
ALT: 8 U/L (ref 0–35)
AST: 13 U/L (ref 0–37)
Albumin: 4.4 g/dL (ref 3.5–5.2)
Alkaline Phosphatase: 86 U/L (ref 39–117)
BUN: 14 mg/dL (ref 6–23)
CO2: 33 meq/L — ABNORMAL HIGH (ref 19–32)
Calcium: 9.5 mg/dL (ref 8.4–10.5)
Chloride: 101 meq/L (ref 96–112)
Creatinine, Ser: 1.09 mg/dL (ref 0.40–1.20)
GFR: 54.61 mL/min — ABNORMAL LOW (ref >60.00)
Glucose, Bld: 88 mg/dL (ref 70–99)
Potassium: 4.2 meq/L (ref 3.5–5.1)
Sodium: 140 meq/L (ref 135–145)
Total Bilirubin: 0.4 mg/dL (ref 0.2–1.2)
Total Protein: 7.3 g/dL (ref 6.0–8.3)

## 2024-05-25 LAB — CBC WITH DIFFERENTIAL/PLATELET
Basophils Absolute: 0.1 K/uL (ref 0.0–0.1)
Basophils Relative: 1.3 % (ref 0.0–3.0)
Eosinophils Absolute: 0.1 K/uL (ref 0.0–0.7)
Eosinophils Relative: 1.4 % (ref 0.0–5.0)
HCT: 38.2 % (ref 36.0–46.0)
Hemoglobin: 12.3 g/dL (ref 12.0–15.0)
Lymphocytes Relative: 44.8 % (ref 12.0–46.0)
Lymphs Abs: 1.8 K/uL (ref 0.7–4.0)
MCHC: 32.1 g/dL (ref 30.0–36.0)
MCV: 89.4 fl (ref 78.0–100.0)
Monocytes Absolute: 0.2 K/uL (ref 0.1–1.0)
Monocytes Relative: 6.2 % (ref 3.0–12.0)
Neutro Abs: 1.8 K/uL (ref 1.4–7.7)
Neutrophils Relative %: 46.3 % (ref 43.0–77.0)
Platelets: 279 K/uL (ref 150.0–400.0)
RBC: 4.27 Mil/uL (ref 3.87–5.11)
RDW: 13.9 % (ref 11.5–15.5)
WBC: 4 K/uL (ref 4.0–10.5)

## 2024-05-25 LAB — LIPASE: Lipase: 25 U/L (ref 11.0–59.0)

## 2024-05-25 MED ORDER — RIFAXIMIN 550 MG PO TABS: 550.0000 mg | ORAL_TABLET | Freq: Three times a day (TID) | ORAL | 0 refills | Status: AC

## 2024-05-25 NOTE — Progress Notes (Signed)
 Traci Console, PA-C [address] [place], [place]  [address] Phone: [phone]   Primary Care Physician: Ilah Crigler, MD  Primary Gastroenterologist:  Traci Console, PA-C / Dr. Gordy Starch   Chief Complaint: Follow-up IBS and GERD   HPI:   Discussed the use of AI scribe software for clinical note transcription with the patient, who gave verbal consent to proceed.  Patient has history of IBS with constipation and diarrhea, GERD and H. pylori eradicated with treatment.  S/p cholecystectomy for cholelithiasis with postsurgical CBD dilation, diverticulosis, anxiety, depression.  Last follow-up visit with Dr. Starch 08/2022.  She is taking pantoprazole  40 mg once a day. She is using glycopyrrolate  2 mg twice a day and taking Align daily. She is using 50 mg of amitriptyline  at bedtime. She will occasionally use Lomotil  or dicyclomine  as needed. History of Present Illness Traci Johnston is a 63 year old female with irritable bowel syndrome who presents with persistent worsening postprandial upper abdominal pain, nausea, vomiting and diarrhea.  Abdominal pain and diarrhea - Persistent, irritating pain located in the upper abdomen for approximately two weeks - Diarrhea occurs approximately 15 minutes after eating, with bowel movements lasting 30 to 45 minutes - Frequent and urgent bowel movements require prolonged periods in the bathroom - No blood in stool, but soreness present due to frequent bowel movements - Vomiting occurs if bowel movements are not completely relieved  Gastrointestinal history and prior evaluations - History of irritable bowel syndrome and gastritis - Previous H. pylori infection, treated after detection on upper endoscopy in July 2021 - Upper endoscopy and colonoscopy in July 2021 showed no polyps; biopsies for microscopic colitis were negative - CT scan of abdomen and pelvis in January 2024 showed no acute findings  Medication use and management -  Pantoprazole  40 mg once daily for acid reflux and gastritis - Glycopyrrolate  twice daily for IBS - Align Probiotic - Amitriptyline  once daily at bedtime - Occasional use of Lomotil  or dicyclomine  for symptom management - Avoids NSAIDs such as Advil or ibuprofen  Impact of psychosocial stressors - Significant stress and loss, including deaths of husband, sister, and other family members - Believes psychosocial stressors have contributed to gastrointestinal symptoms  Nutritional status and weight - Unable to eat a full meal due to gastrointestinal symptoms - Weight stable over the past two years - Current weight is 197 pounds  12/2019 last colonoscopy by Dr. Starch: Small internal hemorrhoids, otherwise normal.  No polyps.  Colon biopsies negative for microscopic colitis.  10-year repeat.  12/2019 last EGD by Dr. Starch: 1 small 8 mm benign hyperplastic gastric polyp biopsied.  Moderate gastritis.  Biopsies positive for H. pylori treated with amoxicillin , clarithromycin , omeprazole .    Current Outpatient Medications  Medication Sig Dispense Refill   ALPRAZolam (XANAX) 1 MG tablet Take 1 mg by mouth 3 (three) times daily as needed.   0   amitriptyline  (ELAVIL ) 50 MG tablet TAKE 1 TABLET(50 MG) BY MOUTH AT BEDTIME 30 tablet 1   amLODipine (NORVASC) 5 MG tablet Take 5 mg by mouth daily.   3   diphenoxylate -atropine  (LOMOTIL ) 2.5-0.025 MG tablet TAKE 1 TABLET BY MOUTH AS NEEDED FOR DIARRHEA OR LOOSE STOOLS 30 tablet 1   FLUoxetine (PROZAC) 40 MG capsule Take 40 mg by mouth daily.      glycopyrrolate  (ROBINUL ) 2 MG tablet TAKE 1 TABLET BY MOUTH TWICE DAILY 180 tablet 0   pantoprazole  (PROTONIX ) 40 MG tablet TAKE 1 TABLET(40 MG)  BY MOUTH DAILY 90 tablet 3   rifaximin  (XIFAXAN ) 550 MG TABS tablet Take 1 tablet (550 mg total) by mouth 3 (three) times daily for 14 days. 42 tablet 0   risankizumab-rzaa (SKYRIZI) 150 MG/ML pen Inject 150 mg into the skin.     bifidobacterium infantis (ALIGN) capsule  Take 1 capsule by mouth daily. (Patient not taking: Reported on 05/25/2024) 7 capsule 0   dicyclomine  (BENTYL ) 20 MG tablet Take 1 tablet (20 mg total) by mouth 2 (two) times daily. (Patient not taking: Reported on 05/25/2024) 20 tablet 0   fluticasone (FLONASE) 50 MCG/ACT nasal spray As needed (Patient not taking: Reported on 05/25/2024)  3   ondansetron  (ZOFRAN ) 4 MG tablet Take 1 tablet (4 mg total) by mouth every 6 (six) hours. (Patient not taking: Reported on 05/25/2024) 20 tablet 0   No current facility-administered medications for this visit.    Allergies as of 05/25/2024 - Review Complete 05/25/2024  Allergen Reaction Noted   Aspirin  09/04/2011   Codeine  11/12/2014    Past Medical History:  Diagnosis Date   Anxiety    Common bile duct dilatation    Depression    Gallstones    GERD (gastroesophageal reflux disease)    hx. 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    Physical Exam:  BP (!) 140/80   Pulse 67   Ht 5' 7 (1.702 m)   Wt 197 lb 6.4 oz (89.5 kg)   BMI 30.92 kg/m  No LMP recorded. Patient has had a hysterectomy.  General: Well-nourished, well-developed in no acute distress.  Depressed affect.  Strong cannabis smell on patient. Lungs: Clear to auscultation bilaterally. Non-labored. Heart: Regular rate and rhythm, no murmurs rubs or gallops.  Abdomen: Bowel sounds are  normal; Abdomen is Soft and obese; No hepatosplenomegaly, masses or hernias; moderate epigastric and generalized upper abdominal tenderness.  Mild lower abdominal Tenderness; No guarding or rebound tenderness. Neuro: Alert and oriented x 3.  Grossly intact.  Psych: Alert and cooperative, depressed mood and affect.    Imaging Studies: No results found.  Labs: CBC    Component Value Date/Time   WBC 11.9 (H) 07/02/2022 1710   RBC 5.04 07/02/2022 1710   HGB 13.8 07/02/2022 1710   HCT 44.5 07/02/2022 1710   PLT 239 07/02/2022 1710   MCV 88.3 07/02/2022 1710   MCH 27.4 07/02/2022 1710   MCHC 31.0 07/02/2022 1710   RDW 14.2 07/02/2022 1710   LYMPHSABS 0.7 07/02/2022 1710   MONOABS 0.3 07/02/2022 1710   EOSABS 0.0 07/02/2022 1710   BASOSABS 0.0 07/02/2022 1710    CMP     Component Value Date/Time   NA 137 07/02/2022 1710   K 4.4 07/02/2022 1710   CL 102 07/02/2022 1710   CO2 21 (L) 07/02/2022 1710  GLUCOSE 129 (H) 07/02/2022 1710   BUN 15 07/02/2022 1710   CREATININE 1.00 07/02/2022 1710   CALCIUM 9.5 07/02/2022 1710   PROT 9.2 (H) 07/02/2022 1710   ALBUMIN 4.7 07/02/2022 1710   AST 30 07/02/2022 1710   ALT 16 07/02/2022 1710   ALKPHOS 131 (H) 07/02/2022 1710   BILITOT 0.9 07/02/2022 1710   GFRNONAA >60 07/02/2022 1710   GFRAA >60 02/04/2016 1840    Assessment and Plan:   Tieasha Larsen is a 63 y.o. y/o female presents for flare up of Chronic GI symptoms:  IBS-D Epigastric Pain Nausea / Vomiting History of H. Pylori infection Prior Cholecystectomy (bile salt diarrhea?)  Plan: - CBC, CMP, Lipase - Scheduling EGD I discussed risks of EGD with patient to include risk of bleeding, perforation, and risk of sedation.  Patient expressed understanding and agrees to proceed with EGD.  - Xifaxan  550mg  TID x 14 days. - Colestid if diarrhea persists  Assessment & Plan Irritable bowel syndrome with diarrhea Chronic diarrhea with urgency and abdominal pain, exacerbated by  stress and post-traumatic stress syndrome. Previous colonoscopy and upper endoscopy negative for polyps and microscopic colitis. No blood in stool reported. - Rx Xifaxan  550mg  TID x 14 days. - Continue glycopyrrolate , Align Probiotic, amitriptyline , and occasional Lomotil  or dicyclomine .  Chronic gastritis with upper abdominal pain, nausea and vomiting - Schedule repeat EGD (**Check Bx for H. Pylori**) - Continue pantoprazole  40 mg daily. - Advised against NSAIDs; recommended Tylenol , no more than four per day.  History of Helicobacter pylori infection Previous H. pylori infection treated with antibiotics. - Ordered repeat EGD - Check for H. Pylori  Postcholecystectomy state with potential bile salt diarrhea.  - diarrhea persists, then Rx Colestid for bile salt diarrhea.   Traci Console, PA-C  Follow up 3 months with TG.

## 2024-05-25 NOTE — Patient Instructions (Addendum)
 Your provider has requested that you go to the basement level for lab work before leaving today. Press B on the elevator. The lab is located at the first door on the left as you exit the elevator.  We have sent the following medications to your pharmacy for you to pick up at your convenience: Xifaxan  550 mg three times daily for 14 days  You have been scheduled for an Endoscopy. Please follow written instructions given to you at your visit today.  If you use inhalers (even only as needed), please bring them with you on the day of your procedure.  If you take any of the following medications, they will need to be adjusted prior to your procedure:   DO NOT TAKE 7 DAYS PRIOR TO TEST- Trulicity (dulaglutide) Ozempic, Wegovy (semaglutide) Mounjaro (tirzepatide) Bydureon Bcise (exanatide extended release)  DO NOT TAKE 1 DAY PRIOR TO YOUR TEST Rybelsus (semaglutide) Adlyxin (lixisenatide) Victoza (liraglutide) Byetta (exanatide) ___________________________________________________________________________  Please follow up sooner if symptoms increase or worsen  Due to recent changes in healthcare laws, you may see the results of your imaging and laboratory studies on MyChart before your provider has had a chance to review them.  We understand that in some cases there may be results that are confusing or concerning to you. Not all laboratory results come back in the same time frame and the provider may be waiting for multiple results in order to interpret others.  Please give us  48 hours in order for your provider to thoroughly review all the results before contacting the office for clarification of your results.   Thank you for trusting me with your gastrointestinal care!   Ellouise Console, PA-C _______________________________________________________  If your blood pressure at your visit was 140/90 or greater, please contact your primary care physician to follow up on  this.  _______________________________________________________  If you are age 18 or older, your body mass index should be between 23-30. Your Body mass index is 30.92 kg/m. If this is out of the aforementioned range listed, please consider follow up with your Primary Care Provider.  If you are age 59 or younger, your body mass index should be between 19-25. Your Body mass index is 30.92 kg/m. If this is out of the aformentioned range listed, please consider follow up with your Primary Care Provider.   ________________________________________________________  The [facility] providers would like to encourage you to use MYCHART to communicate with providers for non-urgent requests or questions.  Due to long hold times on the telephone, sending your provider a message by [facility] may be a faster and more efficient way to get a response.  Please allow 48 business hours for a response.  Please remember that this is for non-urgent requests.  _______________________________________________________

## 2024-05-27 ENCOUNTER — Ambulatory Visit: Payer: Self-pay | Admitting: Physician Assistant

## 2024-05-28 ENCOUNTER — Telehealth: Payer: Self-pay | Admitting: Physician Assistant

## 2024-05-28 ENCOUNTER — Other Ambulatory Visit (HOSPITAL_COMMUNITY): Payer: Self-pay

## 2024-05-28 ENCOUNTER — Telehealth: Payer: Self-pay

## 2024-05-28 NOTE — Telephone Encounter (Signed)
 Pharmacy Patient Advocate Encounter   Received notification from CoverMyMeds that prior authorization for Xifaxan  550MG  tablets is required/requested.   Insurance verification completed.   The patient is insured through [facility].   Per test claim: PA required; PA submitted to above mentioned insurance via Latent Key/confirmation #/EOC [id number] Status is pending

## 2024-05-28 NOTE — Telephone Encounter (Signed)
 Inbound call from patient stating that the medication Ellouise Console sent over to her pharmacy she is going to need a PA form from us . Patient stated that the medication without it would be 2000 dollars. Patient is requesting a call back. Please advise.

## 2024-05-29 ENCOUNTER — Other Ambulatory Visit (HOSPITAL_COMMUNITY): Payer: Self-pay

## 2024-05-29 NOTE — Telephone Encounter (Signed)
 I called the patient and she stated that someone from our office had already given her a call. Patient is aware medication was approved.

## 2024-05-29 NOTE — Telephone Encounter (Signed)
 Pharmacy Patient Advocate Encounter  Received notification from [facility] that Prior Authorization for Xifaxan  550MG  tablets has been APPROVED from 05-28-2024 to 05-28-2025   PA #/Case ID/Reference #: [id number]

## 2024-06-01 ENCOUNTER — Other Ambulatory Visit: Payer: Self-pay | Admitting: Internal Medicine

## 2024-06-19 ENCOUNTER — Other Ambulatory Visit: Payer: Self-pay

## 2024-06-25 ENCOUNTER — Telehealth: Payer: Self-pay

## 2024-06-25 NOTE — Telephone Encounter (Signed)
 Patient's pharmacy is requesting Xifaxan  550 mg but I am unable to find it on the patient med list. Please advise.

## 2024-06-26 ENCOUNTER — Ambulatory Visit (AMBULATORY_SURGERY_CENTER): Admitting: Internal Medicine

## 2024-06-26 ENCOUNTER — Encounter: Payer: Self-pay | Admitting: Internal Medicine

## 2024-06-26 VITALS — BP 111/69 | HR 61 | Temp 97.4°F | Resp 14 | Ht 67.0 in | Wt 197.0 lb

## 2024-06-26 DIAGNOSIS — K449 Diaphragmatic hernia without obstruction or gangrene: Secondary | ICD-10-CM | POA: Diagnosis not present

## 2024-06-26 DIAGNOSIS — R112 Nausea with vomiting, unspecified: Secondary | ICD-10-CM

## 2024-06-26 DIAGNOSIS — Z8619 Personal history of other infectious and parasitic diseases: Secondary | ICD-10-CM

## 2024-06-26 DIAGNOSIS — R1013 Epigastric pain: Secondary | ICD-10-CM

## 2024-06-26 DIAGNOSIS — K31A0 Gastric intestinal metaplasia, unspecified: Secondary | ICD-10-CM

## 2024-06-26 DIAGNOSIS — K2950 Unspecified chronic gastritis without bleeding: Secondary | ICD-10-CM | POA: Diagnosis present

## 2024-06-26 MED ORDER — SODIUM CHLORIDE 0.9 % IV SOLN: 500.0000 mL | INTRAVENOUS | Status: DC

## 2024-06-26 NOTE — Op Note (Signed)
 [facility] Patient Name: Traci Johnston Procedure Date: 06/26/2024 10:36 AM MRN: [id number] Endoscopist: Gordy CHRISTELLA Starch , MD, [id number] Age: 64 Referring MD:  Date of Birth: 21-Jun-1961 Gender: Female Account #: [id number] Procedure:                Upper GI endoscopy Indications:              Epigastric abdominal pain, Nausea with vomiting,                            History of H. Pylori infection in 2021 treated with                            triple therapy at that time. Medicines:                Monitored Anesthesia Care Procedure:                Pre-Anesthesia Assessment:                           - Prior to the procedure, a History and Physical                            was performed, and patient medications and                            allergies were reviewed. The patient's tolerance of                            previous anesthesia was also reviewed. The risks                            and benefits of the procedure and the sedation                            options and risks were discussed with the patient.                            All questions were answered, and informed consent                            was obtained. Prior Anticoagulants: The patient has                            taken no anticoagulant or antiplatelet agents. ASA                            Grade Assessment: II - A patient with mild systemic                            disease. After reviewing the risks and benefits,                            the patient was deemed in satisfactory condition to  undergo the procedure.                           After obtaining informed consent, the endoscope was                            passed under direct vision. Throughout the                            procedure, the patient's blood pressure, pulse, and                            oxygen saturations were monitored continuously. The                            GIF [id number] #[id number] was  introduced through the                            mouth, and advanced to the second part of duodenum.                            The upper GI endoscopy was accomplished without                            difficulty. The patient tolerated the procedure                            well. Scope In: Scope Out: Findings:                 The examined esophagus was normal.                           A small hiatal hernia was present.                           A single 10 mm submucosal papule (nodule) with no                            stigmata of recent bleeding was found in the                            gastric antrum. Tunnelled biopsies were taken with                            a cold forceps for histology. This lesion was                            present in 2021 and is largely unchanged.                           The exam of the stomach was otherwise normal.                           Biopsies were taken with a cold forceps  in the                            gastric body, at the incisura and in the gastric                            antrum for Helicobacter pylori testing.                           The examined duodenum was normal. Complications:            No immediate complications. Estimated Blood Loss:     Estimated blood loss was minimal. Impression:               - Normal esophagus.                           - Small hiatal hernia.                           - A single submucosal papule (nodule) found in the                            stomach. Stable in size compared to 2021. Biopsied.                           - Normal examined duodenum.                           - Biopsies were taken with a cold forceps for                            Helicobacter pylori testing. Recommendation:           - Patient has a contact number available for                            emergencies. The signs and symptoms of potential                            delayed complications were discussed with the                             patient. Return to normal activities tomorrow.                            Written discharge instructions were provided to the                            patient.                           - Resume previous diet.                           - Continue present medications.                           -  Await pathology results. Gordy CHRISTELLA Starch, MD 06/26/2024 11:19:37 AM This report has been signed electronically.

## 2024-06-26 NOTE — Progress Notes (Unsigned)
 Called to room to assist during endoscopic procedure.  Patient ID and intended procedure confirmed with present staff. Received instructions for my participation in the procedure from the performing physician.

## 2024-06-26 NOTE — Progress Notes (Unsigned)
 Pt's states no medical or surgical changes since previsit or office visit.

## 2024-06-26 NOTE — Progress Notes (Unsigned)
 "   GASTROENTEROLOGY PROCEDURE H&P NOTE   Primary Care Physician: Ilah Crigler, MD    Reason for Procedure:   Epigastric pain, n/v   Plan:    egd  Patient is appropriate for endoscopic procedure(s) in the ambulatory ([facility]) setting.  The nature of the procedure, as well as the risks, benefits, and alternatives were carefully and thoroughly reviewed with the patient. Ample time for discussion and questions allowed.  All questions were answered. The patient understood, was satisfied, and agreed with the plan to proceed.    HPI: Traci Johnston is a 64 y.o. female who presents for egd.  Medical history as below.  No recent chest pain or shortness of breath.  No abdominal pain today.  Past Medical History:  Diagnosis Date   Anxiety    Common bile duct dilatation    Depression    Gallstones    GERD (gastroesophageal reflux disease)    hx. 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 Authorizing Provider  ALPRAZolam (XANAX) 1 MG tablet Take 1 mg by mouth 3 (three) times daily as needed.  04/13/18  Yes [provider]  amitriptyline  (ELAVIL ) 50 MG tablet TAKE 1 TABLET(50 MG) BY MOUTH AT BEDTIME 02/14/24  Yes Roemello Speyer, Gordy HERO, MD  amLODipine (NORVASC) 5 MG tablet Take 5 mg by mouth daily.  03/26/18  Yes [provider]  diphenoxylate -atropine  (LOMOTIL ) 2.5-0.025 MG tablet TAKE 1 TABLET BY MOUTH AS  NEEDED FOR DIARRHEA OR LOOSE STOOLS 06/04/24  Yes Bernice Mullin, Gordy HERO, MD  FLUoxetine (PROZAC) 40 MG capsule Take 40 mg by mouth daily.    Yes [provider]  glycopyrrolate  (ROBINUL ) 2 MG tablet TAKE 1 TABLET BY MOUTH TWICE DAILY 09/13/23  Yes Edel Rivero, Gordy HERO, MD  pantoprazole  (PROTONIX ) 40 MG tablet TAKE 1 TABLET(40 MG) BY MOUTH DAILY 08/05/23  Yes Zehr, Jessica D, PA-C  bifidobacterium infantis (ALIGN) capsule Take 1 capsule by mouth daily. Patient not taking: Reported on 05/25/2024 12/31/13   Eustolia Drennen, Gordy HERO, MD  dicyclomine  (BENTYL ) 20 MG tablet Take 1 tablet (20 mg total) by mouth 2 (two) times daily. Patient not taking: Reported on 05/25/2024 07/02/22   Dreama Longs, MD  fluticasone [facility]) 50 MCG/ACT nasal spray As needed Patient not taking: Reported on 05/25/2024 03/29/18   [provider]  ondansetron  (ZOFRAN ) 4 MG tablet Take 1 tablet (4 mg total) by mouth every 6 (six) hours. Patient not taking: No sig reported 07/02/22   Dreama Longs, MD  risankizumab-rzaa [facility]) 150 MG/ML pen Inject 150 mg into the skin. 04/02/24   [provider]    Current Outpatient Medications  Medication Sig Dispense Refill   ALPRAZolam (XANAX) 1 MG tablet Take 1 mg by mouth 3 (three) times daily as needed.   0   amitriptyline  (ELAVIL ) 50 MG tablet TAKE 1 TABLET(50 MG) BY MOUTH  AT BEDTIME 30 tablet 1   amLODipine (NORVASC) 5 MG tablet Take 5 mg by mouth daily.   3   diphenoxylate -atropine  (LOMOTIL ) 2.5-0.025 MG tablet TAKE 1 TABLET BY MOUTH AS NEEDED FOR DIARRHEA OR LOOSE STOOLS 30 tablet 1   FLUoxetine (PROZAC) 40 MG capsule Take 40 mg by mouth daily.      glycopyrrolate  (ROBINUL ) 2 MG tablet TAKE 1 TABLET BY MOUTH TWICE DAILY 180 tablet 0   pantoprazole  (PROTONIX ) 40 MG tablet TAKE 1 TABLET(40 MG) BY MOUTH DAILY 90 tablet 3   bifidobacterium infantis (ALIGN) capsule Take 1 capsule by mouth daily. (Patient not taking: Reported on 05/25/2024) 7 capsule 0   dicyclomine  (BENTYL ) 20 MG  tablet Take 1 tablet (20 mg total) by mouth 2 (two) times daily. (Patient not taking: Reported on 05/25/2024) 20 tablet 0   fluticasone (FLONASE) 50 MCG/ACT nasal spray As needed (Patient not taking: Reported on 05/25/2024)  3   ondansetron  (ZOFRAN ) 4 MG tablet Take 1 tablet (4 mg total) by mouth every 6 (six) hours. (Patient not taking: No sig reported) 20 tablet 0   risankizumab-rzaa (SKYRIZI) 150 MG/ML pen Inject 150 mg into the skin.     Current Facility-Administered Medications  Medication Dose Route Frequency Provider Last Rate Last Admin   0.9 %  sodium chloride  infusion  500 mL Intravenous Continuous Francis Yardley, Gordy HERO, MD        Allergies as of 06/26/2024 - Review Complete 06/26/2024  Allergen Reaction Noted   Aspirin Other (See Comments) 09/04/2011   Codeine Other (See Comments) 11/12/2014    Family History  Problem Relation Age of Onset   Breast cancer Mother    Prostate cancer Father    Breast cancer Sister    Colon cancer Neg Hx    Esophageal cancer Neg Hx    Rectal cancer Neg Hx    Stomach cancer Neg Hx     Social History   Socioeconomic History   Marital status: Married    Spouse name: Lamar   Number of children: 3   Years of education: Not on file   Highest education level: Not on file  Occupational History   Occupation: unemployed  Tobacco Use   Smoking status: Former    Current packs/day: 0.00    Average packs/day: 0.5 packs/day for 25.0 years (12.5 ttl pk-yrs)    Types: Cigarettes    Start date: 06/03/1987    Quit date: 06/02/2012    Years since quitting: 12.0   Smokeless tobacco: Never   Tobacco comments:    05-2012///Will use electronic cigarette   Vaping Use   Vaping status: Some Days  Substance and Sexual Activity   Alcohol use: Yes    Comment: rare occ.   Drug use: No   Sexual activity: Yes    Birth control/protection: Surgical  Other Topics Concern   Not on file  Social History Narrative   Not on file   Social Drivers of Health    Tobacco Use: Medium Risk (06/26/2024)   Patient History    Smoking Tobacco Use: Former    Smokeless Tobacco Use: Never    Passive Exposure: Not on Actuary Strain: Not on file  Food Insecurity: Not on file  Transportation Needs: Not on file  Physical Activity: Not on file  Stress: Not on file  Social Connections: Not on file  Intimate Partner Violence: Not on file  Depression (EYV7-0): Not on file  Alcohol Screen: Not on file  Housing: Not  on file  Utilities: Not on file  Health Literacy: Not on file    Physical Exam: Vital signs in last 24 hours: @BP  119/72   Pulse 74   Temp (!) 97.4 F (36.3 C) (Temporal)   Ht 5' 7 (1.702 m)   Wt 197 lb (89.4 kg)   SpO2 100%   BMI 30.85 kg/m  GEN: NAD EYE: Sclerae anicteric ENT: MMM CV: Non-tachycardic Pulm: CTA b/l GI: Soft, NT/ND NEURO:  Alert & Oriented x 3   Gordy Starch, MD [facility]  06/26/2024 10:45 AM  "

## 2024-06-26 NOTE — Progress Notes (Unsigned)
 To pacu, VSS. Report to Rn.tb

## 2024-06-26 NOTE — Patient Instructions (Signed)

## 2024-06-27 ENCOUNTER — Telehealth: Payer: Self-pay

## 2024-06-27 NOTE — Telephone Encounter (Signed)
 Attempted f/u call. No answer, left VM.

## 2024-06-28 LAB — SURGICAL PATHOLOGY

## 2024-06-28 NOTE — Telephone Encounter (Signed)
 I called the patient and she did not answer a voicemail was left.

## 2024-06-29 ENCOUNTER — Ambulatory Visit: Payer: Self-pay | Admitting: Internal Medicine

## 2024-06-29 ENCOUNTER — Other Ambulatory Visit (HOSPITAL_COMMUNITY): Payer: Self-pay

## 2024-06-29 ENCOUNTER — Telehealth: Payer: Self-pay

## 2024-06-29 DIAGNOSIS — Z8619 Personal history of other infectious and parasitic diseases: Secondary | ICD-10-CM

## 2024-06-29 MED ORDER — TALICIA 250-12.5-10 MG PO CPDR: 4.0000 | DELAYED_RELEASE_CAPSULE | Freq: Three times a day (TID) | ORAL | 0 refills | Status: DC

## 2024-06-29 NOTE — Telephone Encounter (Signed)
 Inbound call from patient stating she is needing  a prior auth for Talicia . Please advise.

## 2024-06-29 NOTE — Telephone Encounter (Signed)
 Pharmacy Patient Advocate Encounter   Received notification from Physician's Office that prior authorization for Talicia  250-12.5-10MG  dr capsules is required/requested.   Insurance verification completed.   The patient is insured through [facility].   Per test claim: PA required; PA submitted to above mentioned insurance via Latent Key/confirmation #/EOC [facility] Status is pending

## 2024-07-02 MED ORDER — AMOXICILLIN 500 MG PO TABS: 1000.0000 mg | ORAL_TABLET | Freq: Three times a day (TID) | ORAL | 0 refills | Status: AC

## 2024-07-02 MED ORDER — RIFABUTIN 150 MG PO CAPS: 150.0000 mg | ORAL_CAPSULE | Freq: Every day | ORAL | 0 refills | Status: AC

## 2024-07-02 NOTE — Addendum Note (Signed)
 Addended by: CLAUDENE NAOMIE SAILOR on: 07/02/2024 12:14 PM   Modules accepted: Orders

## 2024-07-02 NOTE — Telephone Encounter (Signed)
 Thank you so much for clarifying  If possible I would Rx the 150 mg tablet and split and dose BID (75 mg BID) If not able to be split then once daily will suffice JMP

## 2024-07-02 NOTE — Telephone Encounter (Signed)
 Dottie, take a look at this medication denial It looks like we can break up Talicia  into its components and prescribe that way as generic rifabutin  is on her formulary She failed previous triple therapy or at least recurred Let me know if you have any questions, twice daily PPI as needed during the treatment as well but it is a component of Talicia  JMP

## 2024-07-02 NOTE — Telephone Encounter (Signed)
 One final clarification...  I only find rifabutin  in 150 mg capsule. Unable to locate at 50 mg capsule. Did you want the 150 mg?

## 2024-07-02 NOTE — Addendum Note (Signed)
 Addended by: CLAUDENE NAOMIE SAILOR on: 07/02/2024 12:44 PM   Modules accepted: Orders

## 2024-07-02 NOTE — Telephone Encounter (Signed)
 Rifabutin  only available in capsule form at 150 mg. Will adjust to Rifuabutin 150 mg capsule once daily x 14 days along with amoxicillin  1000 mg TID x 14 days and pantoprazole  40 mg twice daily

## 2024-07-02 NOTE — Telephone Encounter (Signed)
 Please provide appropriate sig for Talicia  breakdown including appropriate dosing and frequency and I will send to the pharmacy.

## 2024-07-02 NOTE — Telephone Encounter (Signed)
 SIG: Pantoprazole  40 mg twice daily (she is on once daily now, increase to BID during antibiotic therapy and after antibiotics return to once daily dosing) x 14 days Amoxicillin  1000 mg 3 times daily x 14 days Rifabutin  50 mg 3 times daily x 14 days  [employer] H. pylori stool antigen 6 weeks after therapy to confirm eradication

## 2024-07-02 NOTE — Telephone Encounter (Signed)
 Pharmacy Patient Advocate Encounter  Received notification from [facility] that Prior Authorization for Talicia  250-12.5-10MG  dr capsules has been DENIED.  Full denial letter will be uploaded to the media tab. See denial reason below.  This medication is not on the formulary. The member must try and fail (did not work), or be unable to take ALL formulary alternatives (due to interactions, side effects, etc.)  In this case, other formulary alternatives are available for the member to take. The member has tried generic amoxicillin .   Alternative medications include (please refer to [facility] formulary): generic rifabutin .   PA #/Case ID/Reference #: [id number]

## 2024-07-06 ENCOUNTER — Telehealth: Payer: Self-pay | Admitting: Internal Medicine

## 2024-07-06 NOTE — Telephone Encounter (Signed)
 I called the patient today. She reports she completed the xifaxan .  Her stools have improved.  She no longer has diarrhea.

## 2024-07-06 NOTE — Telephone Encounter (Signed)
 Incoming call from pt regarding rifabutin  (MYCOBUTIN ). Pt stated she is unsure if she should continue taking glycopyrrolate  (ROBINUL ). Please advise. Thank you.

## 2024-07-06 NOTE — Telephone Encounter (Signed)
 Went over detailed instructions with patient over telephone of H. Pylori treatment. Patient states she wants to make sure she does not have to stop any medications (like Robinul ) while taking her treatment medications for H. Pylori. Informed patient she should not have to stop any medications. Patient verbalized understanding.

## 2024-07-15 ENCOUNTER — Other Ambulatory Visit: Payer: Self-pay | Admitting: Internal Medicine

## 2024-07-24 ENCOUNTER — Other Ambulatory Visit: Payer: Self-pay | Admitting: Internal Medicine
